# Patient Record
Sex: Male | Born: 2000 | Race: White | Hispanic: No | Marital: Single | Smoking: Former smoker
Health system: Southern US, Community
[De-identification: ages and names within clinical notes are randomized; demographics above are authoritative.]

## PROBLEM LIST (undated history)

## (undated) DIAGNOSIS — K5190 Ulcerative colitis, unspecified, without complications: Secondary | ICD-10-CM

## (undated) DIAGNOSIS — T8859XA Other complications of anesthesia, initial encounter: Secondary | ICD-10-CM

## (undated) DIAGNOSIS — F419 Anxiety disorder, unspecified: Secondary | ICD-10-CM

## (undated) DIAGNOSIS — S299XXA Unspecified injury of thorax, initial encounter: Secondary | ICD-10-CM

## (undated) HISTORY — DX: Ulcerative colitis, unspecified, without complications: K51.90

---
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
## 2020-06-26 ENCOUNTER — Emergency Department (HOSPITAL_COMMUNITY): Payer: Medicaid Other

## 2020-06-26 ENCOUNTER — Encounter (HOSPITAL_COMMUNITY): Payer: Self-pay | Admitting: Emergency Medicine

## 2020-06-26 ENCOUNTER — Other Ambulatory Visit: Payer: Self-pay

## 2020-06-26 DIAGNOSIS — M25572 Pain in left ankle and joints of left foot: Secondary | ICD-10-CM | POA: Diagnosis present

## 2020-06-26 DIAGNOSIS — Z5321 Procedure and treatment not carried out due to patient leaving prior to being seen by health care provider: Secondary | ICD-10-CM | POA: Diagnosis not present
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
## 2020-06-27 ENCOUNTER — Emergency Department (HOSPITAL_COMMUNITY)
Admission: EM | Admit: 2020-06-27 | Discharge: 2020-06-27 | Disposition: A | Discharge status: Home / Self Care | Payer: Medicaid Other | Attending: Emergency Medicine | Admitting: Emergency Medicine

## 2020-11-17 ENCOUNTER — Other Ambulatory Visit: Payer: Self-pay

## 2020-11-17 ENCOUNTER — Encounter (HOSPITAL_COMMUNITY): Payer: Self-pay | Admitting: *Deleted

## 2020-11-17 ENCOUNTER — Emergency Department (HOSPITAL_COMMUNITY)
Admission: EM | Admit: 2020-11-17 | Discharge: 2020-11-17 | Disposition: A | Discharge status: Home / Self Care | Payer: Medicaid Other | Attending: Emergency Medicine | Admitting: Emergency Medicine

## 2020-11-17 DIAGNOSIS — K51819 Other ulcerative colitis with unspecified complications: Secondary | ICD-10-CM

## 2020-11-17 DIAGNOSIS — K51919 Ulcerative colitis, unspecified with unspecified complications: Secondary | ICD-10-CM | POA: Diagnosis not present

## 2020-11-17 DIAGNOSIS — Z7722 Contact with and (suspected) exposure to environmental tobacco smoke (acute) (chronic): Secondary | ICD-10-CM | POA: Diagnosis not present

## 2020-11-17 DIAGNOSIS — R1084 Generalized abdominal pain: Secondary | ICD-10-CM | POA: Diagnosis present

## 2020-11-17 LAB — CBC WITH DIFFERENTIAL/PLATELET
Abs Immature Granulocytes: 0.07 10*3/uL (ref 0.00–0.07)
Basophils Absolute: 0.1 10*3/uL (ref 0.0–0.1)
Basophils Relative: 0 %
Eosinophils Absolute: 1 10*3/uL — ABNORMAL HIGH (ref 0.0–0.5)
Eosinophils Relative: 6 %
HCT: 49.7 % (ref 39.0–52.0)
Hemoglobin: 16.7 g/dL (ref 13.0–17.0)
Immature Granulocytes: 0 %
Lymphocytes Relative: 10 %
Lymphs Abs: 1.8 10*3/uL (ref 0.7–4.0)
MCH: 30.9 pg (ref 26.0–34.0)
MCHC: 33.6 g/dL (ref 30.0–36.0)
MCV: 91.9 fL (ref 80.0–100.0)
Monocytes Absolute: 1 10*3/uL (ref 0.1–1.0)
Monocytes Relative: 5 %
Neutro Abs: 13.9 10*3/uL — ABNORMAL HIGH (ref 1.7–7.7)
Neutrophils Relative %: 79 %
Platelets: 375 10*3/uL (ref 150–400)
RBC: 5.41 MIL/uL (ref 4.22–5.81)
RDW: 13.3 % (ref 11.5–15.5)
WBC: 17.7 10*3/uL — ABNORMAL HIGH (ref 4.0–10.5)
nRBC: 0 % (ref 0.0–0.2)

## 2020-11-17 LAB — BASIC METABOLIC PANEL
Anion gap: 11 (ref 5–15)
BUN: 13 mg/dL (ref 6–20)
CO2: 27 mmol/L (ref 22–32)
Calcium: 9.6 mg/dL (ref 8.9–10.3)
Chloride: 99 mmol/L (ref 98–111)
Creatinine, Ser: 0.87 mg/dL (ref 0.61–1.24)
GFR, Estimated: >60 mL/min (ref >60)
Glucose, Bld: 93 mg/dL (ref 70–99)
Potassium: 3.9 mmol/L (ref 3.5–5.1)
Sodium: 137 mmol/L (ref 135–145)

## 2020-11-17 MED ORDER — PREDNISONE 50 MG PO TABS
60.0000 mg | ORAL_TABLET | Freq: Once | ORAL | Status: AC
  Administered 2020-11-17: 60 mg via ORAL
  Filled 2020-11-17: qty 1

## 2020-11-17 MED ORDER — PREDNISONE 20 MG PO TABS: ORAL_TABLET | ORAL | 0 refills | Status: DC

## 2020-11-17 MED ORDER — ONDANSETRON 4 MG PO TBDP: 4.0000 mg | ORAL_TABLET | Freq: Three times a day (TID) | ORAL | 0 refills | Status: DC | PRN

## 2020-11-17 NOTE — Discharge Instructions (Addendum)
Return if any problems. Schedule to see Gi for evaluation

## 2020-11-17 NOTE — ED Triage Notes (Signed)
Abdominal pain x 2 weeks

## 2020-11-17 NOTE — ED Provider Notes (Signed)
[facility] EMERGENCY DEPARTMENT Provider Note   CSN: [id number] Arrival date & time: 11/17/20  1446     History Chief Complaint  Patient presents with  . Abdominal Pain    Jerry DEFINO is a 20 y.o. male.  The history is provided by the patient. No language interpreter was used.  Abdominal Pain Pain location:  Generalized Pain quality: aching   Pain radiates to:  Does not radiate Pain severity:  Moderate Onset quality:  Gradual Timing:  Constant Progression:  Worsening Chronicity:  New Relieved by:  Nothing Worsened by:  Nothing Ineffective treatments:  None tried Associated symptoms: anorexia and nausea   Risk factors: no recent hospitalization    Pt has a long history of UC followed at [place].  Pt has not been on medication in over a year  Pt reports this feels like his uc flares.  Pt is not currently on any medications.  He has responded well to prednisone in the past.     Past Medical History:  Diagnosis Date  . Anxiety    Is in therapy, not on medication  . Chest wall injury 2010   Hit by baseball, radiography without fracture, discharged home  . Chronic ulcerative colitis [facility])     Patient Active Problem List   Diagnosis Date Noted  . 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 Contact with and suspected exposure to environmental tobacco smoke 11/28/2011    Past Surgical History:  Procedure Laterality Date  . COLONOSCOPY  12/02/2011   Procedure: COLONOSCOPY;  Surgeon: Oletha Blend, MD;  Location: [place];  Service: Gastroenterology;  Laterality: N/A;       Family History  Problem Relation Age of Onset  . Diabetes Other        Maternal family  . Asthma Other        maternal family  . Cancer Other        maternal family  . Heart disease Other        Maternal GGF later in life   . Ulcerative colitis Other        Unrelated step maternal GF   . GI problems Cousin        Mother began asking  paternal family about GI problems and learned of 2 young cousins with "bowel problems".     Social History   Tobacco Use  . Smoking status: Passive Smoke Exposure - Never Smoker  . Smokeless tobacco: Never Used  Vaping Use  . Vaping Use: Every day  Substance Use Topics  . Alcohol use: No  . Drug use: No    Home Medications Prior to Admission medications   Medication Sig Start Date End Date Taking? Authorizing Provider  azaTHIOprine (IMURAN) 50 MG tablet Take 1.5 tablets (75 mg total) by mouth daily. 04/03/14 04/04/15  Oletha Blend, MD  omeprazole (PRILOSEC) 20 MG capsule Take 1 capsule (20 mg total) by mouth daily. 09/12/13 09/12/14  Oletha Blend, MD  predniSONE (DELTASONE) 20 MG tablet Take 1 tablet (20 mg total) by mouth 2 (two) times daily with a meal. 04/03/14 04/04/15  Oletha Blend, MD    Allergies    Penicillins  Review of Systems   Review of Systems  Gastrointestinal: Positive for abdominal pain, anorexia and nausea.  All other systems reviewed and are negative.   Physical Exam Updated Vital Signs BP (!) 126/50   Pulse 89   Temp 98.6 F (  37 C)   Resp 20   Ht 6' 1"  (1.854 m)   SpO2 100%   BMI 18.47 kg/m   Physical Exam Vitals and nursing note reviewed.  Constitutional:      Appearance: He is well-developed.  HENT:     Head: Normocephalic and atraumatic.  Eyes:     Conjunctiva/sclera: Conjunctivae normal.  Cardiovascular:     Rate and Rhythm: Normal rate and regular rhythm.     Heart sounds: No murmur heard.   Pulmonary:     Effort: Pulmonary effort is normal. No respiratory distress.     Breath sounds: Normal breath sounds.  Abdominal:     General: Abdomen is flat. Bowel sounds are normal.     Palpations: Abdomen is soft.     Tenderness: There is no abdominal tenderness.  Musculoskeletal:     Cervical back: Neck supple.  Skin:    General: Skin is warm and dry.  Neurological:     General: No focal deficit present.     Mental Status: He is  alert.  Psychiatric:        Mood and Affect: Mood normal.     ED Results / Procedures / Treatments   Labs (all labs ordered are listed, but only abnormal results are displayed) Labs Reviewed  CBC WITH DIFFERENTIAL/PLATELET - Abnormal; Notable for the following components:      Result Value   WBC 17.7 (*)    Neutro Abs 13.9 (*)    Eosinophils Absolute 1.0 (*)    All other components within normal limits  BASIC METABOLIC PANEL    EKG None  Radiology No results found.  Procedures Procedures   Medications Ordered in ED Medications - No data to display  ED Course  I have reviewed the triage vital signs and the nursing notes.  Pertinent labs & imaging results that were available during my care of the patient were reviewed by me and considered in my medical decision making (see chart for details).    MDM Rules/Calculators/A&P                          MDM:  Pt referred to gi for evaluation.  Rx for zofran and prednsione.  Pt advised to return if any problems.   Final Clinical Impression(s) / ED Diagnoses Final diagnoses:  Other ulcerative colitis with complication [facility])    Rx / DC Orders ED Discharge Orders         Ordered    Ambulatory referral to Gastroenterology        11/17/20 1722    predniSONE (DELTASONE) 20 MG tablet        11/17/20 1727    ondansetron (ZOFRAN ODT) 4 MG disintegrating tablet  Every 8 hours PRN        11/17/20 1733        An After Visit Summary was printed and given to the patient.    [name], Hershal Coria 11/17/20 [place], Julie, MD 11/17/20 Johnnye Lana

## 2020-11-22 ENCOUNTER — Other Ambulatory Visit: Payer: Self-pay

## 2020-11-22 ENCOUNTER — Emergency Department (HOSPITAL_COMMUNITY): Payer: Medicaid Other

## 2020-11-22 ENCOUNTER — Encounter (HOSPITAL_COMMUNITY): Payer: Self-pay | Admitting: Emergency Medicine

## 2020-11-22 ENCOUNTER — Emergency Department (HOSPITAL_COMMUNITY)
Admission: EM | Admit: 2020-11-22 | Discharge: 2020-11-22 | Disposition: A | Discharge status: Home / Self Care | Payer: Medicaid Other | Attending: Emergency Medicine | Admitting: Emergency Medicine

## 2020-11-22 DIAGNOSIS — Z7722 Contact with and (suspected) exposure to environmental tobacco smoke (acute) (chronic): Secondary | ICD-10-CM | POA: Insufficient documentation

## 2020-11-22 DIAGNOSIS — R079 Chest pain, unspecified: Secondary | ICD-10-CM | POA: Diagnosis not present

## 2020-11-22 LAB — BASIC METABOLIC PANEL
Anion gap: 11 (ref 5–15)
BUN: 11 mg/dL (ref 6–20)
CO2: 27 mmol/L (ref 22–32)
Calcium: 9.3 mg/dL (ref 8.9–10.3)
Chloride: 101 mmol/L (ref 98–111)
Creatinine, Ser: 0.85 mg/dL (ref 0.61–1.24)
GFR, Estimated: >60 mL/min (ref >60)
Glucose, Bld: 124 mg/dL — ABNORMAL HIGH (ref 70–99)
Potassium: 3.7 mmol/L (ref 3.5–5.1)
Sodium: 139 mmol/L (ref 135–145)

## 2020-11-22 LAB — CBC WITH DIFFERENTIAL/PLATELET
Abs Immature Granulocytes: 0.06 10*3/uL (ref 0.00–0.07)
Basophils Absolute: 0.1 10*3/uL (ref 0.0–0.1)
Basophils Relative: 1 %
Eosinophils Absolute: 0.4 10*3/uL (ref 0.0–0.5)
Eosinophils Relative: 3 %
HCT: 40.8 % (ref 39.0–52.0)
Hemoglobin: 13.8 g/dL (ref 13.0–17.0)
Immature Granulocytes: 0 %
Lymphocytes Relative: 30 %
Lymphs Abs: 4.4 10*3/uL — ABNORMAL HIGH (ref 0.7–4.0)
MCH: 30.8 pg (ref 26.0–34.0)
MCHC: 33.8 g/dL (ref 30.0–36.0)
MCV: 91.1 fL (ref 80.0–100.0)
Monocytes Absolute: 1.1 10*3/uL — ABNORMAL HIGH (ref 0.1–1.0)
Monocytes Relative: 8 %
Neutro Abs: 8.5 10*3/uL — ABNORMAL HIGH (ref 1.7–7.7)
Neutrophils Relative %: 58 %
Platelets: 428 10*3/uL — ABNORMAL HIGH (ref 150–400)
RBC: 4.48 MIL/uL (ref 4.22–5.81)
RDW: 13.3 % (ref 11.5–15.5)
WBC: 14.6 10*3/uL — ABNORMAL HIGH (ref 4.0–10.5)
nRBC: 0 % (ref 0.0–0.2)

## 2020-11-22 LAB — TROPONIN I (HIGH SENSITIVITY)
Troponin I (High Sensitivity): 3 ng/L (ref <18)
Troponin I (High Sensitivity): 2 ng/L (ref <18)

## 2020-11-22 NOTE — ED Notes (Signed)
Pt back from x-ray.

## 2020-11-22 NOTE — ED Triage Notes (Signed)
Pt c/o sharp chest pains starting this morning. Pt states its in the left chest and does not radiate.   Pt had a pain during triage in the central chest during triage and says it feels like heart burn.

## 2020-11-22 NOTE — Discharge Instructions (Signed)
Follow up with primary care provider

## 2020-11-22 NOTE — ED Provider Notes (Signed)
[facility] EMERGENCY DEPARTMENT Provider Note   CSN: [id number] Arrival date & time: 11/22/20  1334     History Chief Complaint  Patient presents with  . Chest Pain    Jerry Cordova is a 20 y.o. male with past medical history significant for ulcerative colitis, anxiety who presents for evaluation of chest pain.  Has intermittent left-sided chest pain.  Described as sharp and stabbing.  Will get a few stabbing feelings and resolve.  He has had this previously. Pain this time over the last 2 weeks. Was seen here for abd pain earlier this week. Dx with likely colitis flare. Given steroids. States he did not mention his CP to the previous provider. States he had this as a child, with cardiology however did not return.  He is unsure if anything was found.  Pain does not radiate into his back, left arm or jaw.  Nonexertional nonpleuritic in nature.  Patient states happens "occasionally."  No recent traumatic injuries to chest.  No cough, shortness of breath or hemoptysis.  No prior history of PE, DVT, unilateral leg pain or edema, redness or warmth.  He has no chest pain with exertion, no syncope, no family history of sudden cardiac death.  Patient states he gets pain for approximately 5 minutes, resolves.  No current pain.  Denies additional aggrivating or alleviating factors.  Has not taken anything for symptoms.  History obtained from patient and past medical records.  No interpreter used.  HPI     Past Medical History:  Diagnosis Date  . Anxiety    Is in therapy, not on medication  . Chest wall injury 2010   Hit by baseball, radiography without fracture, discharged home  . Chronic ulcerative colitis [facility])     Patient Active Problem List   Diagnosis Date Noted  . Universal ulcerative colitis ([place]) 12/05/2011  . 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 Contact with and suspected exposure to environmental tobacco smoke 11/28/2011    Past Surgical History:  Procedure Laterality  Date  . COLONOSCOPY  12/02/2011   Procedure: COLONOSCOPY;  Surgeon: Oletha Blend, MD;  Location: [place];  Service: Gastroenterology;  Laterality: N/A;       Family History  Problem Relation Age of Onset  . Diabetes Other        Maternal family  . Asthma Other        maternal family  . Cancer Other        maternal family  . Heart disease Other        Maternal GGF later in life   . Ulcerative colitis Other        Unrelated step maternal GF   . GI problems Cousin        Mother began asking paternal family about GI problems and learned of 2 young cousins with "bowel problems".     Social History   Tobacco Use  . Smoking status: Passive Smoke Exposure - Never Smoker  . Smokeless tobacco: Never Used  Vaping Use  . Vaping Use: Every day  Substance Use Topics  . Alcohol use: No  . Drug use: No    Home Medications Prior to Admission medications   Medication Sig Start Date End Date Taking? Authorizing Provider  ondansetron (ZOFRAN ODT) 4 MG disintegrating tablet Take 1 tablet (4 mg total) by mouth every 8 (eight) hours as needed for nausea or vomiting. 11/17/20  Yes Caryl Ada K, PA-C  predniSONE (DELTASONE) 20 MG tablet One  tablet twice a day for 5 days then one a day for 5 days 11/17/20  Yes [name], PA-C  sertraline (ZOLOFT) 25 MG tablet Take 25 mg by mouth daily. 11/20/20  Yes [provider]  azaTHIOprine (IMURAN) 50 MG tablet Take 1.5 tablets (75 mg total) by mouth daily. 04/03/14 04/04/15  Oletha Blend, MD  omeprazole (PRILOSEC) 20 MG capsule Take 1 capsule (20 mg total) by mouth daily. 09/12/13 09/12/14  Oletha Blend, MD    Allergies    Penicillins  Review of Systems   Review of Systems  Constitutional: Negative.   HENT: Negative.   Respiratory: Negative.   Cardiovascular: Positive for chest pain. Negative for palpitations and leg swelling.  Gastrointestinal: Negative.   Genitourinary: Negative.   Musculoskeletal: Negative.   Skin: Negative.    Neurological: Negative.   All other systems reviewed and are negative.   Physical Exam Updated Vital Signs BP (!) 130/59   Pulse (!) 59   Temp 98 F (36.7 C)   Resp 18   Ht 6' 1"  (1.854 m)   Wt 63.5 kg   SpO2 98%   BMI 18.47 kg/m   Physical Exam Vitals and nursing note reviewed.  Constitutional:      General: He is not in acute distress.    Appearance: He is well-developed. He is not ill-appearing, toxic-appearing or diaphoretic.  HENT:     Head: Normocephalic and atraumatic.  Eyes:     Pupils: Pupils are equal, round, and reactive to light.  Cardiovascular:     Rate and Rhythm: Normal rate and regular rhythm.     Pulses:          Radial pulses are 2+ on the right side and 2+ on the left side.     Heart sounds: Normal heart sounds.  Pulmonary:     Effort: Pulmonary effort is normal. No respiratory distress.     Breath sounds: Normal breath sounds.     Comments: Clear to auscultation bilaterally.  Speaks in full sentences without difficulty. Chest:     Comments: Equal rise and fall to chest wall.  No tenderness, crepitus or step-off Abdominal:     General: Bowel sounds are normal. There is no distension.     Palpations: Abdomen is soft.     Tenderness: There is no abdominal tenderness. There is no guarding or rebound.     Comments: Soft, nontender  Musculoskeletal:        General: Normal range of motion.     Cervical back: Normal range of motion and neck supple.     Right lower leg: No tenderness. No edema.     Left lower leg: No tenderness. No edema.     Comments: No bony tenderness.  Moves all 4 extremities at difficulty.  Compartment soft.  Skin:    General: Skin is warm and dry.     Capillary Refill: Capillary refill takes less than 2 seconds.     Comments: No rashes or lesions  Neurological:     General: No focal deficit present.     Mental Status: He is alert.     Comments: Moves all 4 extremities at difficulty.  Ambulatory    ED Results / Procedures /  Treatments   Labs (all labs ordered are listed, but only abnormal results are displayed) Labs Reviewed  CBC WITH DIFFERENTIAL/PLATELET - Abnormal; Notable for the following components:      Result Value   WBC 14.6 (*)  Platelets 428 (*)    Neutro Abs 8.5 (*)    Lymphs Abs 4.4 (*)    Monocytes Absolute 1.1 (*)    All other components within normal limits  BASIC METABOLIC PANEL - Abnormal; Notable for the following components:   Glucose, Bld 124 (*)    All other components within normal limits  TROPONIN I (HIGH SENSITIVITY)  TROPONIN I (HIGH SENSITIVITY)    EKG EKG Interpretation  Date/Time:  Saturday November 22 2020 13:41:54 EDT Ventricular Rate:  85 PR Interval:  143 QRS Duration: 98 QT Interval:  352 QTC Calculation: 419 R Axis:   76 Text Interpretation: Sinus rhythm Nonspecific T abnormalities, lateral leads No STEMI Confirmed by Octaviano Glow [phone]) on 11/22/2020 4:31:47 PM   Radiology DG Chest 2 View  Result Date: 11/22/2020 CLINICAL DATA:  Chest pain EXAM: CHEST - 2 VIEW COMPARISON:  Jan 06, 2009 FINDINGS: The lungs are clear. Heart size and pulmonary vascularity are normal. No adenopathy. No pneumothorax. There is slight lower thoracic levoscoliosis. IMPRESSION: Lungs clear.  Cardiac silhouette normal. Electronically Signed   By: Lowella Grip III M.D.   On: 11/22/2020 15:04    Procedures Procedures   Medications Ordered in ED Medications - No data to display  ED Course  I have reviewed the triage vital signs and the nursing notes.  Pertinent labs & imaging results that were available during my care of the patient were reviewed by me and considered in my medical decision making (see chart for details).  20 year old here for evaluation of intermittent left-sided chest pain over the last 2 weeks.  Has history of similar.  Is nonexertional nonpleuritic in nature.  No clinical evidence of DVT on exam.  He currently has no pain.  No family history of sudden  cardiac death.  He is PERC negative, Wells criteria low risk.  No cough, shortness of breath.  No neck pain, back pain, jaw pain.  We will plan on labs, imaging and reassess.  Labs and imaging personally reviewed and interpreted:  Leukocytosis at 14.6, similar to prior, on steroids Metabolic panel without electrolyte, renal or normality Delta troponin negative EKG without ischemia, no WPW, prolonged QT, Brugada,  DG chest without cardiomegaly, pleural edema, pneumothorax  Patient reassessed.  Continues to be chest pain-free.  Work-up reassuring.  He has had this pain previously and was seen by cardiology.  We will have him follow-up with them.  Patient is to be discharged with recommendation to follow up with PCP in regards to today's hospital visit. Chest pain is not likely of cardiac or pulmonary etiology d/t presentation, PERC negative, VSS, no tracheal deviation, no JVD or new murmur, RRR, breath sounds equal bilaterally, EKG without acute abnormalities, negative troponin, and negative CXR.  Low suspicion for acute ACS, PE, dissection, HCM, CHF, Pneumothorax, infectious process.  The patient has been appropriately medically screened and/or stabilized in the ED. I have low suspicion for any other emergent medical condition which would require further screening, evaluation or treatment in the ED or require inpatient management.  Patient is hemodynamically stable and in no acute distress.  Patient able to ambulate in department prior to ED.  Evaluation does not show acute pathology that would require ongoing or additional emergent interventions while in the emergency department or further inpatient treatment.  I have discussed the diagnosis with the patient and answered all questions.  Pain is been managed while in the emergency department and patient has no further complaints prior to discharge.  Patient  is comfortable with plan discussed in room and is stable for discharge at this time.  I have  discussed strict return precautions for returning to the emergency department.  Patient was encouraged to follow-up with PCP/specialist refer to at discharge.    MDM Rules/Calculators/A&P                           Final Clinical Impression(s) / ED Diagnoses Final diagnoses:  Chest pain, unspecified type    Rx / DC Orders ED Discharge Orders    None       Ethon Wymer A, PA-C 11/22/20 Daron Offer, MD 11/23/20 [phone]

## 2020-11-30 ENCOUNTER — Other Ambulatory Visit: Payer: Self-pay

## 2020-11-30 ENCOUNTER — Emergency Department (HOSPITAL_COMMUNITY): Payer: Medicaid Other

## 2020-11-30 ENCOUNTER — Encounter (HOSPITAL_COMMUNITY): Payer: Self-pay | Admitting: *Deleted

## 2020-11-30 ENCOUNTER — Emergency Department (HOSPITAL_COMMUNITY)
Admission: EM | Admit: 2020-11-30 | Discharge: 2020-11-30 | Disposition: A | Discharge status: Home / Self Care | Payer: Medicaid Other | Attending: Emergency Medicine | Admitting: Emergency Medicine

## 2020-11-30 DIAGNOSIS — Z20822 Contact with and (suspected) exposure to covid-19: Secondary | ICD-10-CM | POA: Insufficient documentation

## 2020-11-30 DIAGNOSIS — R0602 Shortness of breath: Secondary | ICD-10-CM | POA: Diagnosis not present

## 2020-11-30 DIAGNOSIS — Z7722 Contact with and (suspected) exposure to environmental tobacco smoke (acute) (chronic): Secondary | ICD-10-CM | POA: Diagnosis not present

## 2020-11-30 DIAGNOSIS — R0789 Other chest pain: Secondary | ICD-10-CM | POA: Insufficient documentation

## 2020-11-30 DIAGNOSIS — R079 Chest pain, unspecified: Secondary | ICD-10-CM | POA: Diagnosis present

## 2020-11-30 LAB — COMPREHENSIVE METABOLIC PANEL
ALT: 23 U/L (ref 0–44)
AST: 21 U/L (ref 15–41)
Albumin: 4.2 g/dL (ref 3.5–5.0)
Alkaline Phosphatase: 85 U/L (ref 38–126)
Anion gap: 9 (ref 5–15)
BUN: 10 mg/dL (ref 6–20)
CO2: 26 mmol/L (ref 22–32)
Calcium: 9.4 mg/dL (ref 8.9–10.3)
Chloride: 100 mmol/L (ref 98–111)
Creatinine, Ser: 0.84 mg/dL (ref 0.61–1.24)
GFR, Estimated: >60 mL/min (ref >60)
Glucose, Bld: 106 mg/dL — ABNORMAL HIGH (ref 70–99)
Potassium: 4.4 mmol/L (ref 3.5–5.1)
Sodium: 135 mmol/L (ref 135–145)
Total Bilirubin: 0.5 mg/dL (ref 0.3–1.2)
Total Protein: 8.7 g/dL — ABNORMAL HIGH (ref 6.5–8.1)

## 2020-11-30 LAB — RESP PANEL BY RT-PCR (FLU A&B, COVID) ARPGX2
Influenza A by PCR: NEGATIVE
Influenza B by PCR: NEGATIVE
SARS Coronavirus 2 by RT PCR: NEGATIVE

## 2020-11-30 LAB — D-DIMER, QUANTITATIVE: D-Dimer, Quant: 1.18 ug/mL-FEU — ABNORMAL HIGH (ref 0.00–0.50)

## 2020-11-30 LAB — TROPONIN I (HIGH SENSITIVITY)
Troponin I (High Sensitivity): 2 ng/L (ref <18)
Troponin I (High Sensitivity): 3 ng/L (ref <18)

## 2020-11-30 LAB — CBC
HCT: 48.8 % (ref 39.0–52.0)
Hemoglobin: 16.1 g/dL (ref 13.0–17.0)
MCH: 30.6 pg (ref 26.0–34.0)
MCHC: 33 g/dL (ref 30.0–36.0)
MCV: 92.6 fL (ref 80.0–100.0)
Platelets: 514 10*3/uL — ABNORMAL HIGH (ref 150–400)
RBC: 5.27 MIL/uL (ref 4.22–5.81)
RDW: 14.1 % (ref 11.5–15.5)
WBC: 21 10*3/uL — ABNORMAL HIGH (ref 4.0–10.5)
nRBC: 0 % (ref 0.0–0.2)

## 2020-11-30 MED ORDER — HYDROCODONE-ACETAMINOPHEN 5-325 MG PO TABS
1.0000 | ORAL_TABLET | Freq: Once | ORAL | Status: AC
  Administered 2020-11-30: 1 via ORAL
  Filled 2020-11-30: qty 1

## 2020-11-30 MED ORDER — ALBUTEROL SULFATE HFA 108 (90 BASE) MCG/ACT IN AERS: 1.0000 | INHALATION_SPRAY | Freq: Four times a day (QID) | RESPIRATORY_TRACT | 0 refills | Status: DC | PRN

## 2020-11-30 MED ORDER — IOHEXOL 350 MG/ML SOLN
75.0000 mL | Freq: Once | INTRAVENOUS | Status: AC | PRN
  Administered 2020-11-30: 75 mL via INTRAVENOUS

## 2020-11-30 NOTE — ED Notes (Signed)
Patient transported to CT 

## 2020-11-30 NOTE — ED Notes (Signed)
Pt ambulated to restroom, states he had chest pain with ambulation, pt also states he feels SOB while ambulating

## 2020-11-30 NOTE — ED Notes (Signed)
Ambulated pt with pulse ox, O2 dropped to 88%, went back up to 97% at rest. Pt stated he felt a little SOB and dizzy, denied chest pain. PA made aware.

## 2020-11-30 NOTE — Discharge Instructions (Addendum)
Your work-up today was overall reassuring.  We have provided an albuterol inhaler for your shortness of breath, you may use this as needed.  Continue to take Tylenol or ibuprofen for pain.  You were tested for COVID-19 today, you may follow-up on these results via [place].  There are instructions on your discharge paperwork on how to download this.  If this is positive, please quarantine for an additional 7 to 10 days.  I have provided cardiology follow-up as well.  Make sure to follow-up with your primary care doctor.  Return to the ER for any new or worsening symptoms.

## 2020-11-30 NOTE — ED Provider Notes (Signed)
[facility] EMERGENCY DEPARTMENT Provider Note   CSN: [id number] Arrival date & time: 11/30/20  1823     History Chief Complaint  Patient presents with  . Chest Pain    DESSIE DELCARLO is a 20 y.o. male.  HPI 20 year old male with a history of anxiety, chest wall injury, chronic ulcerative colitis, recent a steroid pack today presents to the ER with complaints of chest pain.  He was seen here on 4/9 with same complaint.  Delta troponin, overall reassuring work-up.  He states that the pain has continued, worse when he walks.  Feels central, sometimes is sharp and stabbing.  He has been taking Tylenol and some ibuprofen for pain with little relief.  He states he followed up with his PCP and was told that he has costochondritis.  His symptoms have continued however.  He also has some shortness of breath with ambulation, though he states that this is not uncommon for him.  He denies any leg swelling.  No prior history of PE.  No fevers, chills, cough.    Past Medical History:  Diagnosis Date  . Anxiety    Is in therapy, not on medication  . Chest wall injury 2010   Hit by baseball, radiography without fracture, discharged home  . Chronic ulcerative colitis [facility])     Patient Active Problem List   Diagnosis Date Noted  . 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 Contact with and suspected exposure to environmental tobacco smoke 11/28/2011    Past Surgical History:  Procedure Laterality Date  . COLONOSCOPY  12/02/2011   Procedure: COLONOSCOPY;  Surgeon: Oletha Blend, MD;  Location: [place];  Service: Gastroenterology;  Laterality: N/A;       Family History  Problem Relation Age of Onset  . Diabetes Other        Maternal family  . Asthma Other        maternal family  . Cancer Other        maternal family  . Heart disease Other        Maternal GGF later in life   . Ulcerative colitis Other        Unrelated step maternal  GF   . GI problems Cousin        Mother began asking paternal family about GI problems and learned of 2 young cousins with "bowel problems".     Social History   Tobacco Use  . Smoking status: Passive Smoke Exposure - Never Smoker  . Smokeless tobacco: Never Used  Vaping Use  . Vaping Use: Every day  Substance Use Topics  . Alcohol use: No  . Drug use: No    Home Medications Prior to Admission medications   Medication Sig Start Date End Date Taking? Authorizing Provider  acetaminophen (TYLENOL) 325 MG tablet Take 650 mg by mouth every 6 (six) hours as needed.   Yes [provider]  albuterol (VENTOLIN HFA) 108 (90 Base) MCG/ACT inhaler Inhale 1-2 puffs into the lungs every 6 (six) hours as needed for wheezing or shortness of breath. 11/30/20  Yes Garald Balding, PA-C  ibuprofen (ADVIL) 200 MG tablet Take 400 mg by mouth every 6 (six) hours as needed.   Yes [provider]  predniSONE (DELTASONE) 20 MG tablet One tablet twice a day for 5 days then one a day for 5 days 11/17/20  Yes Threasa Alpha, Hollace Kinnier, PA-C  azaTHIOprine (IMURAN) 50 MG tablet Take  1.5 tablets (75 mg total) by mouth daily. Patient not taking: Reported on 11/30/2020 04/03/14 04/04/15  Oletha Blend, MD  NATROBA 0.9 % SUSP Apply topically. Patient not taking: Reported on 11/30/2020 11/28/20   [provider]  omeprazole (PRILOSEC) 20 MG capsule Take 1 capsule (20 mg total) by mouth daily. Patient not taking: Reported on 11/30/2020 09/12/13 09/12/14  Oletha Blend, MD  ondansetron (ZOFRAN ODT) 4 MG disintegrating tablet Take 1 tablet (4 mg total) by mouth every 8 (eight) hours as needed for nausea or vomiting. Patient not taking: Reported on 11/30/2020 11/17/20   [name], PA-C  sertraline (ZOLOFT) 25 MG tablet Take 25 mg by mouth daily. Patient not taking: Reported on 11/30/2020 11/20/20   [provider]    Allergies    Penicillins  Review of Systems   Review of Systems   Constitutional: Negative for chills and fever.  HENT: Negative for ear pain and sore throat.   Eyes: Negative for pain and visual disturbance.  Respiratory: Positive for shortness of breath. Negative for cough.   Cardiovascular: Positive for chest pain. Negative for palpitations.  Gastrointestinal: Negative for abdominal pain and vomiting.  Genitourinary: Negative for dysuria and hematuria.  Musculoskeletal: Negative for arthralgias and back pain.  Skin: Negative for color change and rash.  Neurological: Negative for seizures and syncope.  All other systems reviewed and are negative.   Physical Exam Updated Vital Signs BP 129/72   Pulse (!) 59   Temp 98.7 F (37.1 C) (Oral)   Resp 17   SpO2 96%   Physical Exam Vitals and nursing note reviewed.  Constitutional:      General: He is not in acute distress.    Appearance: He is well-developed. He is not ill-appearing, toxic-appearing or diaphoretic.  HENT:     Head: Normocephalic and atraumatic.  Eyes:     Conjunctiva/sclera: Conjunctivae normal.  Cardiovascular:     Rate and Rhythm: Normal rate and regular rhythm.     Heart sounds: No murmur heard.   Pulmonary:     Effort: Pulmonary effort is normal. No respiratory distress.     Breath sounds: Normal breath sounds.  Chest:       Comments: Reproducible chest wall tenderness to the sternum. Abdominal:     Palpations: Abdomen is soft.     Tenderness: There is no abdominal tenderness.  Musculoskeletal:        General: Normal range of motion.     Cervical back: Neck supple.     Right lower leg: No edema.     Left lower leg: No edema.  Skin:    General: Skin is warm and dry.     Capillary Refill: Capillary refill takes less than 2 seconds.  Neurological:     General: No focal deficit present.     Mental Status: He is alert and oriented to person, place, and time.     Sensory: Sensory deficit present.     Motor: No weakness.     ED Results / Procedures /  Treatments   Labs (all labs ordered are listed, but only abnormal results are displayed) Labs Reviewed  CBC - Abnormal; Notable for the following components:      Result Value   WBC 21.0 (*)    Platelets 514 (*)    All other components within normal limits  COMPREHENSIVE METABOLIC PANEL - Abnormal; Notable for the following components:   Glucose, Bld 106 (*)    Total Protein 8.7 (*)  All other components within normal limits  D-DIMER, QUANTITATIVE - Abnormal; Notable for the following components:   D-Dimer, Quant 1.18 (*)    All other components within normal limits  RESP PANEL BY RT-PCR (FLU A&B, COVID) ARPGX2  TROPONIN I (HIGH SENSITIVITY)  TROPONIN I (HIGH SENSITIVITY)    EKG EKG Interpretation  Date/Time:  Sunday November 30 2020 19:04:38 EDT Ventricular Rate:  67 PR Interval:  143 QRS Duration: 85 QT Interval:  372 QTC Calculation: 393 R Axis:   75 Text Interpretation: Sinus rhythm ST elev, probable normal early repol pattern Confirmed by Zammit, Joseph ([id number]) on 11/30/2020 7:14:02 PM   Radiology CT Angio Chest PE W and/or Wo Contrast  Result Date: 11/30/2020 CLINICAL DATA:  Left-sided chest pain EXAM: CT ANGIOGRAPHY CHEST WITH CONTRAST TECHNIQUE: Multidetector CT imaging of the chest was performed using the standard protocol during bolus administration of intravenous contrast. Multiplanar CT image reconstructions and MIPs were obtained to evaluate the vascular anatomy. CONTRAST:  75mL OMNIPAQUE IOHEXOL 350 MG/ML SOLN COMPARISON:  Chest x-ray from earlier in the same day. FINDINGS: Cardiovascular: Thoracic aorta and its branches are within normal limits. No cardiac enlargement is seen. No coronary calcifications are noted. Pulmonary artery is well visualized. Normal branching pattern is noted. No filling defects to suggest pulmonary embolism are identified. Mediastinum/Nodes: Thoracic inlet is within normal limits. No sizable hilar or mediastinal adenopathy is noted. The  esophagus is within normal limits. Lungs/Pleura: The lungs are well aerated bilaterally. No focal infiltrate or sizable effusion is seen. Upper Abdomen: Visualized upper abdomen shows no acute abnormality. Musculoskeletal: No acute bony abnormality is noted. Review of the MIP images confirms the above findings. IMPRESSION: No acute abnormality noted.  No pulmonary emboli are seen. Electronically Signed   By: Mark  Lukens M.D.   On: 11/30/2020 20:45   DG Chest Portable 1 View  Result Date: 11/30/2020 CLINICAL DATA:  Pt with continued left sided chest pain and SOB x a few weeks. Pt denies a cough. No previous surgery to chest. No pending Covid test. EXAM: PORTABLE CHEST 1 VIEW COMPARISON:  Chest radiograph 11/22/2020 FINDINGS: The cardiomediastinal contours are within normal limits. The lungs are clear. No pneumothorax or pleural effusion. No acute finding in the visualized skeleton. IMPRESSION: No acute cardiopulmonary process. Electronically Signed   By: Nancy  Ballantyne M.D.   On: 11/30/2020 19:30    Procedures Procedures   Medications Ordered in ED Medications  iohexol (OMNIPAQUE) 350 MG/ML injection 75 mL (75 mLs Intravenous Contrast Given 11/30/20 2028)  HYDROcodone-acetaminophen (NORCO/VICODIN) 5-325 MG per tablet 1 tablet (1 tablet Oral Given 11/30/20 2303)    ED Course  I have reviewed the triage vital signs and the nursing notes.  Pertinent labs & imaging results that were available during my care of the patient were reviewed by me and considered in my medical decision making (see chart for details).    MDM Rules/Calculators/A&P                          19  year old male presents with chest pain.  On arrival, vitals are overall reassuring, afebrile, not tachycardic, tachypneic or hypoxic.  Lung sounds are clear, no wheezes.  He does have reproducible chest wall tenderness on exam.  I did explain to the patient that he had a pretty reassuring work-up on the 9th, however did think it was  reasonable to repeat a chest x-ray and an EKG.  His chest x-ray was normal, EKG did  show some slight changes with questionable ST elevations in all leads.  He does have a leukocytosis of 21, however has been taking a steroid pack, finishing his last dose today.  His CMP is largely unremarkable.  D-dimer was elevated at 1.18.  Repeat troponin is negative.  Patient was ambulated, did desaturate to 88%, with some mildly shortness of breath, however quickly recovered to 97%. Discussed this with Dr. Roderic Palau who feels given reassuring workup this does not warrant admission.   He is not requiring any supplemental oxygen.  Again no evidence of PE on scan.  Will swab for Covid, however this has been ongoing for quite some time and my suspicion for COVID-19 is low at this time.  Patient given an albuterol inhaler to use, encouraged to continue to take Tylenol and ibuprofen.  He was given a dose of Norco here.  Stressed follow-up with PCP, will refer to cardiology as well.  We discussed return precautions.  He voiced understanding and is agreeable.  Stable for discharge.  Case discussed with Dr. Roderic Palau who is agreeable to the above plan and disposition Final Clinical Impression(s) / ED Diagnoses Final diagnoses:  Chest pain, unspecified type    Rx / DC Orders ED Discharge Orders         Ordered    albuterol (VENTOLIN HFA) 108 (90 Base) MCG/ACT inhaler  Every 6 hours PRN        11/30/20 2259           Garald Balding, PA-C 11/30/20 2318    Milton Ferguson, MD 12/02/20 [phone]

## 2020-11-30 NOTE — ED Triage Notes (Signed)
Pt with continued left sided CP.  Last seen on 11/22/20 for same.  + sob

## 2020-12-20 ENCOUNTER — Encounter: Payer: Self-pay | Admitting: Gastroenterology

## 2020-12-20 NOTE — H&P (View-Only) (Signed)
 Referring Provider: Sofia, Leslie K, PA-C ([place] [facility]) Primary Care Physician:  [facility] Primary Gastroenterologist:  Dr. Carver  Chief Complaint  Patient presents with  . Ulcerative Colitis    Had UC for 9 years. Some flares recently. Rectal bleeding    HPI:   Jerry Cordova is a 20 y.o. male presenting today at the request of Sofia, Leslie K, PA-C ( ED) for ulcerative colitis.   Patient has history of ulcerative pancolitis diagnosed in 2013. Previously followed by pediatric GI at [facility].  He was on Remicade at one point, but he developed antibodies and became symptomatic so he was transitioned to Humira with methotrexate.  Notably, he also had a urticarial rashes with Remicade infusions and this was treated by antihistamines.  It appears patient was last seen by [facility] in May 2020 and was on Humira 40 mg every other week with methotrexate 15mg weekly and folate.  Symptoms were doing well at that time and he was advised to continue his current medication regimen.  Patient was seen in the emergency room 11/17/2020 for abdominal pain.  Reported he has not been on any medications for UC in over a year, but felt the symptoms were similar to flares.  CBC remarkable for WBC 17.7.  BMP within normal limits.  He was prescribed Zofran and prednisone 20 mg twice daily x5 days then 1 daily for 5 days.    Today:  Has not been on any medications for ulcerative colitis in over a year.  States he felt very well after stopping his medications until about 1-2 months ago when he began to develop lower abdominal pain and diarrhea with rectal bleeding.  Somewhat similar to prior UC flares.  Pain can radiate to his upper abdomen which is different than prior UC flares.  Pain comes and goes and can be triggered by food.  Can get up to 8/10 in severity.  Does not take anything for abdominal pain.  Reports 1 on prednisone, this did not help his abdominal pain, did not change his  diarrhea.  Currently with 5-10+ BMs daily.  No rectal bleeding in the last 2 days.  No melena. Denies recent antibiotics, hospitalizations, sick contacts, fever, chills, cold or flulike symptoms.  Admits to weight loss.  Usually weighs around 145 pounds.  Occasional lightheadedness with position changes.  Denies nausea, vomiting.  Rare reflux symptoms.  No dysphagia.  No NSAIDs.  Colonoscopy and EGD in 2019: Colonoscopy impression: Moderately active ulcerative colitis; pancolitis with some patulous in the rectum, biopsied.  The examined portion of the ileum was normal s/p biopsy.  TI biopsy was benign, cecal biopsy with quiescent chronic colitis, no active colitis, ascending colon biopsy benign, transverse colon biopsy benign, mild chronic active colitis in the descending and sigmoid colon.  Rectum with mild active colitis, no chronic colitis.   EGD: Normal exam.  Duodenal biopsies with no inflammation or villous blunting.  Gastric biopsies with mild patchy chronic gastritis.  H. pylori negative.  Esophageal biopsies benign.  Past Medical History:  Diagnosis Date  . Anxiety    Is in therapy, not on medication  . Chest wall injury 2010   Hit by baseball, radiography without fracture, discharged home  . Chronic ulcerative colitis (HCC)     Past Surgical History:  Procedure Laterality Date  . COLONOSCOPY  12/02/2011    Surgeon: Joseph H Clark, MD; entire colonic mucosa with marked friability, granularity, edema, and erythema with no   skip lesions or rectal sparing.  Unable to negotiate through the ascending colon to directly visualize the cecum.  Multiple biopsies were taken and all revealed chronic active colitis consistent with IBD.  . COLONOSCOPY  2019   [facility]- Moderately active UC, pancolitis with some patulous in the rectum.  Pathology: TI biopsy benign, cecal biopsy with quiescent chronic colitis, no active colitis, ascending and transverse colon biopsies benign, mild chronic active colitis in  the descending and sigmoid colon, mild active colitis in the rectum.  . ESOPHAGOGASTRODUODENOSCOPY  2019   [facility]; Normal exam.  Duodenal biopsies with no inflammation or villous blunting.  Gastric biopsies with mild patchy chronic gastritis.  H. pylori negative.  Esophageal biopsies benign.    Current Outpatient Medications  Medication Sig Dispense Refill  . acetaminophen (TYLENOL) 325 MG tablet Take 650 mg by mouth every 6 (six) hours as needed.    . albuterol (VENTOLIN HFA) 108 (90 Base) MCG/ACT inhaler Inhale 1-2 puffs into the lungs every 6 (six) hours as needed for wheezing or shortness of breath. 1 each 0   No current facility-administered medications for this visit.    Allergies as of 12/22/2020 - Review Complete 12/22/2020  Allergen Reaction Noted  . Penicillins Hives and Rash 11/28/2011  . Remicade [infliximab] Hives 12/22/2020    Family History  Problem Relation Age of Onset  . Diabetes Other        Maternal family  . Asthma Other        maternal family  . Cancer Other        maternal family  . Heart disease Other        Maternal GGF later in life   . Ulcerative colitis Other        Unrelated step maternal GF   . GI problems Cousin        Mother began asking paternal family about GI problems and learned of 2 young cousins with "bowel problems".   . Colon cancer Neg Hx     Social History   Socioeconomic History  . Marital status: Single    Spouse name: Not on file  . Number of children: Not on file  . Years of education: Not on file  . Highest education level: Not on file  Occupational History  . Not on file  Tobacco Use  . Smoking status: Passive Smoke Exposure - Never Smoker  . Smokeless tobacco: Current User    Types: Chew  Vaping Use  . Vaping Use: Every day  Substance and Sexual Activity  . Alcohol use: No  . Drug use: No  . Sexual activity: Never  Other Topics Concern  . Not on file  Social History Narrative  . Not on file   Social  Determinants of Health   Financial Resource Strain: Not on file  Food Insecurity: Not on file  Transportation Needs: Not on file  Physical Activity: Not on file  Stress: Not on file  Social Connections: Not on file  Intimate Partner Violence: Not on file    Review of Systems: Gen: See HPI  CV: Denies chest pain or palpitations. Resp: Denies shortness of breath or cough. GI: See HPI. GU : Denies urinary burning, urinary frequency, urinary hesitancy MS: Denies joint pain Derm: Denies rash Psych: History of anxiety. Heme: See HPI  Physical Exam: BP 126/79   Pulse 76   Temp (!) 96.9 F (36.1 C) (Temporal)   Ht 6' 1" (1.854 m)   Wt 138 lb 12.8 oz (  63 kg)   BMI 18.31 kg/m  General:   Alert and oriented. Pleasant and cooperative. Well-developed. Thin.  Head:  Normocephalic and atraumatic. Eyes:  Without icterus, sclera clear and conjunctiva pink.  Ears:  Normal auditory acuity. Lungs:  Clear to auscultation bilaterally. No wheezes, rales, or rhonchi. No distress.  Heart:  S1, S2 present without murmurs appreciated.  Abdomen:  +BS, soft, and non-distended. Minimal TTP across the lower abdomen. No HSM noted. No guarding or rebound. No masses appreciated.  Rectal:  Deferred  Msk:  Symmetrical without gross deformities. Normal posture. Extremities:  Without edema. Neurologic:  Alert and  oriented x4;  grossly normal neurologically. Skin:  Intact without significant lesions or rashes. Psych: Normal mood and affect.   Assessment: 19-year-old male with history of ulcerative pancolitis diagnosed in 2013 previously followed by [facility] pediatric GI last on Humira every other week and methotrexate weekly, but not on any UC medications for over a year,  presenting today to establish care reporting 1-2 months of abdominal pain with associated bloody diarrhea. Also reports about 7 lb weight loss. Symptoms somewhat similar to prior UC flares, though pain can radiate to his upper abdomen which  is different.  Evaluated in the emergency room in April and was placed on a 10-day course of prednisone which he reports did help with his abdominal pain, but frequent watery diarrhea persisted. No rectal bleeding in 2 days.  Denies fever, chills, nausea, or vomiting.  No NSAIDs.  Last colonoscopy in 2019 with mild chronic active colitis in the descending and sigmoid colon, mild active colitis in the rectum.  On exam today, he has very minimal TTP across the lower abdomen.  Suspect we are dealing with UC flare; however, cannot rule out infectious diarrhea.  We will update labs, check stool studies, and obtain biologic pretreatment labs.  If stool studies are negative, will start budesonide 9 mg daily. Ultimately, he will need to resume daily biologic therapy for ulcerative colitis.  Notably, symptoms are well controlled on Humira and methotrexate.  Previously on Remicade, but developed antibodies and also had urticarial rashes with infusions.   He is also over due for colonoscopy. Will discuss and timing of colonoscopy and maintenance UC meds with Dr. Carver once we rule out infectious diarrhea.    Plan:  1.  CBC, CMP, ESR, sed rate, C. difficile GDH and toxin A/B, GI pathogen panel, hepatitis B surface antigen, hepatitis B surface antibody, hepatitis B core antibody total, hepatitis C antibody, QuantiFERON-TB gold, varicella-zoster IgG.  2.  Dietary adjustments: Low residue/low fiber diet. 4-6 small meals daily. Avoid fried, fatty, greasy, spicy foods. Limit carbonated beverages.  3.  Add protein shakes, boost, Ensure, or other meal replacement twice daily to help maintain weight.  4.  Needs colonoscopy in the near future.  We will determine timing once infectious diarrhea has been ruled out.  5.  Patient/mother request follow-up with Dr. Carver.      Jyren Cerasoli, PA-C [facility] 12/22/2020  

## 2020-12-20 NOTE — Progress Notes (Signed)
Referring Provider: [name], PA-C [facility]) Primary Care Physician:  [facility] Primary Gastroenterologist:  Dr. Abbey Chatters  Chief Complaint  Patient presents with  . Ulcerative Colitis    Had UC for 9 years. Some flares recently. Rectal bleeding    HPI:   Jerry Cordova is a 20 y.o. male presenting today at the request of [name], PA-C [facility]) for ulcerative colitis.   Patient has history of ulcerative pancolitis diagnosed in 2013. Previously followed by pediatric GI at [facility].  He was on Remicade at one point, but he developed antibodies and became symptomatic so he was transitioned to Humira with methotrexate.  Notably, he also had a urticarial rashes with Remicade infusions and this was treated by antihistamines.  It appears patient was last seen by [facility] in May 2020 and was on Humira 40 mg every other week with methotrexate 31m weekly and folate.  Symptoms were doing well at that time and he was advised to continue his current medication regimen.  Patient was seen in the emergency room 11/17/2020 for abdominal pain.  Reported he has not been on any medications for UC in over a year, but felt the symptoms were similar to flares.  CBC remarkable for WBC 17.7.  BMP within normal limits.  He was prescribed Zofran and prednisone 20 mg twice daily x5 days then 1 daily for 5 days.    Today:  Has not been on any medications for ulcerative colitis in over a year.  States he felt very well after stopping his medications until about 1-2 months ago when he began to develop lower abdominal pain and diarrhea with rectal bleeding.  Somewhat similar to prior UC flares.  Pain can radiate to his upper abdomen which is different than prior UC flares.  Pain comes and goes and can be triggered by food.  Can get up to 8/10 in severity.  Does not take anything for abdominal pain.  Reports 1 on prednisone, this did not help his abdominal pain, did not change his  diarrhea.  Currently with 5-10+ BMs daily.  No rectal bleeding in the last 2 days.  No melena. Denies recent antibiotics, hospitalizations, sick contacts, fever, chills, cold or flulike symptoms.  Admits to weight loss.  Usually weighs around 145 pounds.  Occasional lightheadedness with position changes.  Denies nausea, vomiting.  Rare reflux symptoms.  No dysphagia.  No NSAIDs.  Colonoscopy and EGD in 2019: Colonoscopy impression: Moderately active ulcerative colitis; pancolitis with some patulous in the rectum, biopsied.  The examined portion of the ileum was normal s/p biopsy.  TI biopsy was benign, cecal biopsy with quiescent chronic colitis, no active colitis, ascending colon biopsy benign, transverse colon biopsy benign, mild chronic active colitis in the descending and sigmoid colon.  Rectum with mild active colitis, no chronic colitis.   EGD: Normal exam.  Duodenal biopsies with no inflammation or villous blunting.  Gastric biopsies with mild patchy chronic gastritis.  H. pylori negative.  Esophageal biopsies benign.  Past Medical History:  Diagnosis Date  . Anxiety    Is in therapy, not on medication  . Chest wall injury 2010   Hit by baseball, radiography without fracture, discharged home  . [facility]     Past Surgical History:  Procedure Laterality Date  . COLONOSCOPY  12/02/2011    Surgeon: JOletha Blend MD; entire colonic mucosa with marked friability, granularity, edema, and erythema with no  skip lesions or rectal sparing.  Unable to negotiate through the ascending colon to directly visualize the cecum.  Multiple biopsies were taken and all revealed chronic active colitis consistent with IBD.  Marland Kitchen COLONOSCOPY  2019   [facility]- Moderately active UC, pancolitis with some patulous in the rectum.  Pathology: TI biopsy benign, cecal biopsy with quiescent chronic colitis, no active colitis, ascending and transverse colon biopsies benign, mild chronic active colitis in  the descending and sigmoid colon, mild active colitis in the rectum.  . ESOPHAGOGASTRODUODENOSCOPY  2019   [facility]; Normal exam.  Duodenal biopsies with no inflammation or villous blunting.  Gastric biopsies with mild patchy chronic gastritis.  H. pylori negative.  Esophageal biopsies benign.    Current Outpatient Medications  Medication Sig Dispense Refill  . acetaminophen (TYLENOL) 325 MG tablet Take 650 mg by mouth every 6 (six) hours as needed.    Marland Kitchen albuterol (VENTOLIN HFA) 108 (90 Base) MCG/ACT inhaler Inhale 1-2 puffs into the lungs every 6 (six) hours as needed for wheezing or shortness of breath. 1 each 0   No current facility-administered medications for this visit.    Allergies as of 12/22/2020 - Review Complete 12/22/2020  Allergen Reaction Noted  . Penicillins Hives and Rash 11/28/2011  . Remicade [infliximab] Hives 12/22/2020    Family History  Problem Relation Age of Onset  . Diabetes Other        Maternal family  . Asthma Other        maternal family  . Cancer Other        maternal family  . Heart disease Other        Maternal GGF later in life   . Ulcerative colitis Other        Unrelated step maternal GF   . GI problems Cousin        Mother began asking paternal family about GI problems and learned of 2 young cousins with "bowel problems".   . Colon cancer Neg Hx     Social History   Socioeconomic History  . Marital status: Single    Spouse name: Not on file  . Number of children: Not on file  . Years of education: Not on file  . Highest education level: Not on file  Occupational History  . Not on file  Tobacco Use  . Smoking status: Passive Smoke Exposure - Never Smoker  . Smokeless tobacco: Current User    Types: Chew  Vaping Use  . Vaping Use: Every day  Substance and Sexual Activity  . Alcohol use: No  . Drug use: No  . Sexual activity: Never  Other Topics Concern  . Not on file  Social History Narrative  . Not on file   Social  Determinants of Health   Financial Resource Strain: Not on file  Food Insecurity: Not on file  Transportation Needs: Not on file  Physical Activity: Not on file  Stress: Not on file  Social Connections: Not on file  Intimate Partner Violence: Not on file    Review of Systems: Gen: See HPI  CV: Denies chest pain or palpitations. Resp: Denies shortness of breath or cough. GI: See HPI. GU : Denies urinary burning, urinary frequency, urinary hesitancy MS: Denies joint pain Derm: Denies rash Psych: History of anxiety. Heme: See HPI  Physical Exam: BP 126/79   Pulse 76   Temp (!) 96.9 F (36.1 C) (Temporal)   Ht _0  (1.854 m)   Wt 138 lb 12.8 oz (  63 kg)   BMI 18.31 kg/m  General:   Alert and oriented. Pleasant and cooperative. Well-developed. Thin.  Head:  Normocephalic and atraumatic. Eyes:  Without icterus, sclera clear and conjunctiva pink.  Ears:  Normal auditory acuity. Lungs:  Clear to auscultation bilaterally. No wheezes, rales, or rhonchi. No distress.  Heart:  S1, S2 present without murmurs appreciated.  Abdomen:  +BS, soft, and non-distended. Minimal TTP across the lower abdomen. No HSM noted. No guarding or rebound. No masses appreciated.  Rectal:  Deferred  Msk:  Symmetrical without gross deformities. Normal posture. Extremities:  Without edema. Neurologic:  Alert and  oriented x4;  grossly normal neurologically. Skin:  Intact without significant lesions or rashes. Psych: Normal mood and affect.   Assessment: 20 year old male with history of ulcerative pancolitis diagnosed in 2013 previously followed by [facility] pediatric GI last on Humira every other week and methotrexate weekly, but not on any UC medications for over a year,  presenting today to establish care reporting 1-2 months of abdominal pain with associated bloody diarrhea. Also reports about 7 lb weight loss. Symptoms somewhat similar to prior UC flares, though pain can radiate to his upper abdomen which  is different.  Evaluated in the emergency room in April and was placed on a 10-day course of prednisone which he reports did help with his abdominal pain, but frequent watery diarrhea persisted. No rectal bleeding in 2 days.  Denies fever, chills, nausea, or vomiting.  No NSAIDs.  Last colonoscopy in 2019 with mild chronic active colitis in the descending and sigmoid colon, mild active colitis in the rectum.  On exam today, he has very minimal TTP across the lower abdomen.  Suspect we are dealing with UC flare; however, cannot rule out infectious diarrhea.  We will update labs, check stool studies, and obtain biologic pretreatment labs.  If stool studies are negative, will start budesonide 9 mg daily. Ultimately, he will need to resume daily biologic therapy for ulcerative colitis.  Notably, symptoms are well controlled on Humira and methotrexate.  Previously on Remicade, but developed antibodies and also had urticarial rashes with infusions.   He is also over due for colonoscopy. Will discuss and timing of colonoscopy and maintenance UC meds with Dr. Abbey Chatters once we rule out infectious diarrhea.    Plan:  1.  CBC, CMP, ESR, sed rate, C. difficile GDH and toxin A/B, GI pathogen panel, hepatitis B surface antigen, hepatitis B surface antibody, hepatitis B core antibody total, hepatitis C antibody, QuantiFERON-TB gold, varicella-zoster IgG.  2.  Dietary adjustments: Low residue/low fiber diet. 4-6 small meals daily. Avoid fried, fatty, greasy, spicy foods. Limit carbonated beverages.  3.  Add protein shakes, boost, Ensure, or other meal replacement twice daily to help maintain weight.  4.  Needs colonoscopy in the near future.  We will determine timing once infectious diarrhea has been ruled out.  5.  Patient/mother request follow-up with Dr. Abbey Chatters.      Aliene Altes, PA-C [facility] 12/22/2020

## 2020-12-22 ENCOUNTER — Encounter: Payer: Self-pay | Admitting: Gastroenterology

## 2020-12-22 ENCOUNTER — Other Ambulatory Visit: Payer: Self-pay

## 2020-12-22 ENCOUNTER — Ambulatory Visit (INDEPENDENT_AMBULATORY_CARE_PROVIDER_SITE_OTHER): Payer: Medicaid Other | Admitting: Gastroenterology

## 2020-12-22 VITALS — BP 126/79 | HR 76 | Temp 96.9°F | Ht 73.0 in | Wt 138.8 lb

## 2020-12-22 DIAGNOSIS — R1030 Lower abdominal pain, unspecified: Secondary | ICD-10-CM | POA: Insufficient documentation

## 2020-12-22 DIAGNOSIS — K51011 Ulcerative (chronic) pancolitis with rectal bleeding: Secondary | ICD-10-CM | POA: Insufficient documentation

## 2020-12-22 DIAGNOSIS — R197 Diarrhea, unspecified: Secondary | ICD-10-CM | POA: Diagnosis not present

## 2020-12-22 NOTE — Patient Instructions (Addendum)
Please have labs and stool studies completed at [facility].  We will call you with results and further recommendations.  As we discussed, you may try following a low residue/low fiber diet for now. Try eating 4-6 small meals daily. I also recommend you avoid fried, fatty, greasy, spicy foods. Limit carbonated beverages.  You may try adding protein shakes, Ensure, Boost, or other meal replacement drink twice daily to help maintain your weight.   You are due for colonoscopy. We will determine timing of this after labs are completed.   Aliene Altes, PA-C [facility]

## 2020-12-24 LAB — COMPLETE METABOLIC PANEL WITH GFR
AG Ratio: 1 (calc) (ref 1.0–2.5)
ALT: 12 U/L (ref 8–46)
AST: 17 U/L (ref 12–32)
Albumin: 3.9 g/dL (ref 3.6–5.1)
Alkaline phosphatase (APISO): 87 U/L (ref 46–169)
BUN/Creatinine Ratio: 7 (calc) (ref 6–22)
BUN: 6 mg/dL — ABNORMAL LOW (ref 7–20)
CO2: 29 mmol/L (ref 20–32)
Calcium: 9.6 mg/dL (ref 8.9–10.4)
Chloride: 103 mmol/L (ref 98–110)
Creat: 0.9 mg/dL (ref 0.60–1.26)
GFR, Est African American: 143 mL/min/{1.73_m2} (ref >=60)
GFR, Est Non African American: 123 mL/min/{1.73_m2} (ref >=60)
Globulin: 3.8 g/dL (calc) — ABNORMAL HIGH (ref 2.1–3.5)
Glucose, Bld: 81 mg/dL (ref 65–99)
Potassium: 4.4 mmol/L (ref 3.8–5.1)
Sodium: 138 mmol/L (ref 135–146)
Total Bilirubin: 0.3 mg/dL (ref 0.2–1.1)
Total Protein: 7.7 g/dL (ref 6.3–8.2)

## 2020-12-24 LAB — SEDIMENTATION RATE: Sed Rate: 17 mm/h — ABNORMAL HIGH (ref 0–15)

## 2020-12-24 LAB — CBC WITH DIFFERENTIAL/PLATELET
Absolute Monocytes: 876 cells/uL (ref 200–950)
Basophils Absolute: 236 cells/uL — ABNORMAL HIGH (ref 0–200)
Basophils Relative: 1.7 %
Eosinophils Absolute: 1376 cells/uL — ABNORMAL HIGH (ref 15–500)
Eosinophils Relative: 9.9 %
HCT: 45.1 % (ref 38.5–50.0)
Hemoglobin: 15.6 g/dL (ref 13.2–17.1)
Lymphs Abs: 4406 cells/uL — ABNORMAL HIGH (ref 850–3900)
MCH: 31.7 pg (ref 27.0–33.0)
MCHC: 34.6 g/dL (ref 32.0–36.0)
MCV: 91.7 fL (ref 80.0–100.0)
MPV: 10.4 fL (ref 7.5–12.5)
Monocytes Relative: 6.3 %
Neutro Abs: 7006 cells/uL (ref 1500–7800)
Neutrophils Relative %: 50.4 %
Platelets: 470 10*3/uL — ABNORMAL HIGH (ref 140–400)
RBC: 4.92 10*6/uL (ref 4.20–5.80)
RDW: 13.7 % (ref 11.0–15.0)
Total Lymphocyte: 31.7 %
WBC: 13.9 10*3/uL — ABNORMAL HIGH (ref 3.8–10.8)

## 2020-12-24 LAB — HEPATITIS C ANTIBODY
Hepatitis C Ab: NONREACTIVE
SIGNAL TO CUT-OFF: 0.05 (ref <1.00)

## 2020-12-24 LAB — HEPATITIS B SURFACE ANTIGEN: Hepatitis B Surface Ag: NONREACTIVE

## 2020-12-24 LAB — QUANTIFERON-TB GOLD PLUS
Mitogen-NIL: >10 IU/mL
NIL: 0.03 IU/mL
QuantiFERON-TB Gold Plus: NEGATIVE
TB1-NIL: 0.01 IU/mL
TB2-NIL: 0.01 IU/mL

## 2020-12-24 LAB — HEPATITIS B SURFACE ANTIBODY,QUALITATIVE: Hep B S Ab: NONREACTIVE

## 2020-12-24 LAB — C-REACTIVE PROTEIN: CRP: 4.7 mg/L (ref <8.0)

## 2020-12-24 LAB — HEPATITIS B CORE ANTIBODY, TOTAL: Hep B Core Total Ab: NONREACTIVE

## 2020-12-24 LAB — VARICELLA ZOSTER ANTIBODY, IGG: Varicella IgG: <135 index — ABNORMAL LOW

## 2020-12-24 NOTE — Progress Notes (Signed)
Cc'ed to pcp °

## 2020-12-24 NOTE — Progress Notes (Signed)
Dear Jerry Cordova,   Your labs look good so far. Everything is within normal limits. Your white blood cells are elevated but that is due to you coming off recent Prednisone Rx. Inflammatory marker sed rate is elevated at 17 ( some inflammation in the body).    We are still waiting on stool studies and a few additional labs. Further recommendations to follow. If you have any questions or concerns please call our office.    Thank you, Oleh Genin, CMA

## 2020-12-29 LAB — GASTROINTESTINAL PATHOGEN PANEL PCR
C. difficile Tox A/B, PCR: NOT DETECTED
Campylobacter, PCR: NOT DETECTED
Cryptosporidium, PCR: NOT DETECTED
E coli (ETEC) LT/ST PCR: NOT DETECTED
E coli (STEC) stx1/stx2, PCR: NOT DETECTED
E coli 0157, PCR: NOT DETECTED
Giardia lamblia, PCR: NOT DETECTED
Norovirus, PCR: NOT DETECTED
Rotavirus A, PCR: NOT DETECTED
Salmonella, PCR: NOT DETECTED
Shigella, PCR: NOT DETECTED

## 2020-12-29 LAB — C. DIFFICILE GDH AND TOXIN A/B
GDH ANTIGEN: NOT DETECTED
MICRO NUMBER:: 11890833
SPECIMEN QUALITY:: ADEQUATE
TOXIN A AND B: NOT DETECTED

## 2020-12-31 ENCOUNTER — Other Ambulatory Visit: Payer: Self-pay | Admitting: Gastroenterology

## 2020-12-31 DIAGNOSIS — K51011 Ulcerative (chronic) pancolitis with rectal bleeding: Secondary | ICD-10-CM

## 2020-12-31 MED ORDER — BUDESONIDE 3 MG PO CPEP: 9.0000 mg | ORAL_CAPSULE | Freq: Every day | ORAL | 0 refills | Status: DC

## 2021-01-01 ENCOUNTER — Encounter: Payer: Self-pay | Admitting: *Deleted

## 2021-01-06 ENCOUNTER — Telehealth: Payer: Self-pay

## 2021-01-06 ENCOUNTER — Telehealth: Payer: Self-pay | Admitting: Internal Medicine

## 2021-01-06 NOTE — Telephone Encounter (Signed)
Spoke with patient.  He reports Sunday, he began to notice it was taking a while for his food to go down his esophagus.  Since that time, symptoms have been worsening.  Now with trouble with foods, liquids, and his saliva.  States items get hung in his mid chest with associated discomfort at that time.  No regurgitation.  States items will eventually pass.  Denies reflux, cold or flulike symptoms, shortness of breath.  He needs EGD for further evaluation.  Notably, after 5/31, his insurance will no longer cover visits with [facility].  I have discussed with Dr. Abbey Chatters who is agreeable to add patient onto his schedule either Friday or Tuesday.  [facility] Clinical Pool:  Please arrange EGD +/- dilation with propofol with Dr. Abbey Chatters either Friday 5/27 or Tuesday 5/31.   ASA II

## 2021-01-06 NOTE — Telephone Encounter (Signed)
Called pt, procedure scheduled for 01/09/21 at 2:45pm. COVID test 01/08/21 at 8:05am. Gave pt EGD instructions on phone. Advised him to be NPO after 10:00am 01/09/21. Orders entered.  Unable to submit PA for EGD/DIL via [facility] website after 2 attempts.

## 2021-01-06 NOTE — Telephone Encounter (Signed)
Pt LMOM that he needed an ASAP procedure to stretch his throat. He was seen in the office a few weeks ago. [phone]

## 2021-01-06 NOTE — Telephone Encounter (Signed)
Pt called and stated that he's having the sensation of something being stuck in his throat. States from his own saliva, liquids, and food feels like it is stuck. States a little indigestion. No N/V present. Sensation in his chest he describes it as being a 8 or 9. States very painful.

## 2021-01-06 NOTE — Telephone Encounter (Signed)
Returned the pt's call , went to vm . LMOVM for the pt to call back

## 2021-01-06 NOTE — Telephone Encounter (Signed)
error 

## 2021-01-07 NOTE — Telephone Encounter (Addendum)
PA submitted for EGD/DIL via [facility] website. Notification/prior authorization reference# [id number]. Case marked as urgent.

## 2021-01-07 NOTE — Telephone Encounter (Signed)
Pt called office and LMOVM requesting to speak to Aliene Altes PA. Said last time he went under they thought he had seizure and he wants her to know before his upcoming procedure scheduled for 01/09/21.

## 2021-01-07 NOTE — Telephone Encounter (Signed)
Called and informed pt.  

## 2021-01-07 NOTE — Telephone Encounter (Signed)
Noted. I suspect he will do ok as this is not general anesthesia. However, will route to Dr. Abbey Chatters to make him aware. Patient can also relay this information to anesthesia when he talks with them the day of his procedure.   Dr. Abbey Chatters, Juluis Rainier

## 2021-01-08 ENCOUNTER — Other Ambulatory Visit: Payer: Self-pay

## 2021-01-08 ENCOUNTER — Other Ambulatory Visit (HOSPITAL_COMMUNITY)
Admission: RE | Admit: 2021-01-08 | Discharge: 2021-01-08 | Disposition: A | Discharge status: Home / Self Care | Payer: Medicaid Other | Source: Ambulatory Visit | Attending: Internal Medicine | Admitting: Internal Medicine

## 2021-01-08 DIAGNOSIS — Z01812 Encounter for preprocedural laboratory examination: Secondary | ICD-10-CM | POA: Diagnosis present

## 2021-01-08 DIAGNOSIS — Z20822 Contact with and (suspected) exposure to covid-19: Secondary | ICD-10-CM | POA: Diagnosis not present

## 2021-01-08 LAB — SARS CORONAVIRUS 2 (TAT 6-24 HRS): SARS Coronavirus 2: NEGATIVE

## 2021-01-08 NOTE — Telephone Encounter (Signed)
PA pending per [facility] website. Called [employer], spoke to [place] (provider service advocate). I had marked case "urgent" but case was showing as routine. [place] opened new case and referenced date case was created yesterday. She accelerated and expedited case. Can take up to 72 hours for decision. Auth ref# [id number]. Call ref# [id number].

## 2021-01-08 NOTE — Telephone Encounter (Signed)
EGD/DIL approved. PA# [id number], valid 01/09/21-04/09/21.

## 2021-01-09 ENCOUNTER — Other Ambulatory Visit: Payer: Self-pay

## 2021-01-09 ENCOUNTER — Encounter (HOSPITAL_COMMUNITY): Payer: Self-pay

## 2021-01-09 ENCOUNTER — Ambulatory Visit (HOSPITAL_COMMUNITY): Payer: Medicaid Other | Admitting: Anesthesiology

## 2021-01-09 ENCOUNTER — Ambulatory Visit (HOSPITAL_COMMUNITY)
Admission: RE | Admit: 2021-01-09 | Discharge: 2021-01-09 | Disposition: A | Discharge status: Home / Self Care | Payer: Medicaid Other | Attending: Internal Medicine | Admitting: Internal Medicine

## 2021-01-09 ENCOUNTER — Telehealth: Payer: Self-pay | Admitting: Internal Medicine

## 2021-01-09 ENCOUNTER — Encounter (HOSPITAL_COMMUNITY)
Admission: RE | Disposition: A | Discharge status: Home / Self Care | Payer: Self-pay | Source: Home / Self Care | Attending: Internal Medicine

## 2021-01-09 DIAGNOSIS — Z888 Allergy status to other drugs, medicaments and biological substances status: Secondary | ICD-10-CM | POA: Insufficient documentation

## 2021-01-09 DIAGNOSIS — Z880 Allergy status to penicillin: Secondary | ICD-10-CM | POA: Diagnosis not present

## 2021-01-09 DIAGNOSIS — Z68.41 Body mass index (BMI) pediatric, 5th percentile to less than 85th percentile for age: Secondary | ICD-10-CM | POA: Diagnosis not present

## 2021-01-09 DIAGNOSIS — B3781 Candidal esophagitis: Secondary | ICD-10-CM

## 2021-01-09 DIAGNOSIS — K2950 Unspecified chronic gastritis without bleeding: Secondary | ICD-10-CM | POA: Diagnosis not present

## 2021-01-09 DIAGNOSIS — R1310 Dysphagia, unspecified: Secondary | ICD-10-CM

## 2021-01-09 DIAGNOSIS — K51011 Ulcerative (chronic) pancolitis with rectal bleeding: Secondary | ICD-10-CM | POA: Diagnosis not present

## 2021-01-09 DIAGNOSIS — R634 Abnormal weight loss: Secondary | ICD-10-CM | POA: Diagnosis not present

## 2021-01-09 DIAGNOSIS — K2970 Gastritis, unspecified, without bleeding: Secondary | ICD-10-CM

## 2021-01-09 DIAGNOSIS — K2100 Gastro-esophageal reflux disease with esophagitis, without bleeding: Secondary | ICD-10-CM | POA: Diagnosis not present

## 2021-01-09 HISTORY — PX: ESOPHAGEAL BRUSHING: SHX6842

## 2021-01-09 HISTORY — PX: BIOPSY: SHX5522

## 2021-01-09 HISTORY — PX: ESOPHAGOGASTRODUODENOSCOPY (EGD) WITH PROPOFOL: SHX5813

## 2021-01-09 HISTORY — PX: BALLOON DILATION: SHX5330

## 2021-01-09 LAB — KOH PREP

## 2021-01-09 SURGERY — ESOPHAGOGASTRODUODENOSCOPY (EGD) WITH PROPOFOL
Anesthesia: General

## 2021-01-09 MED ORDER — STERILE WATER FOR IRRIGATION IR SOLN
Status: DC | PRN
  Administered 2021-01-09: 1.5 mL

## 2021-01-09 MED ORDER — FLUCONAZOLE 200 MG PO TABS: ORAL_TABLET | ORAL | 0 refills | Status: DC

## 2021-01-09 MED ORDER — PROPOFOL 10 MG/ML IV BOLUS
INTRAVENOUS | Status: DC | PRN
  Administered 2021-01-09 (×2): 50 mg via INTRAVENOUS
  Administered 2021-01-09: 100 mg via INTRAVENOUS
  Administered 2021-01-09: 50 mg via INTRAVENOUS

## 2021-01-09 MED ORDER — LIDOCAINE HCL (CARDIAC) PF 100 MG/5ML IV SOSY
PREFILLED_SYRINGE | INTRAVENOUS | Status: DC | PRN
  Administered 2021-01-09: 60 mg via INTRATRACHEAL

## 2021-01-09 MED ORDER — OMEPRAZOLE 20 MG PO CPDR: 20.0000 mg | DELAYED_RELEASE_CAPSULE | Freq: Every day | ORAL | 5 refills | Status: DC

## 2021-01-09 MED ORDER — LACTATED RINGERS IV SOLN: INTRAVENOUS | Status: DC

## 2021-01-09 NOTE — Anesthesia Postprocedure Evaluation (Signed)
Anesthesia Post Note  Patient: Jerry Cordova  Procedure(s) Performed: ESOPHAGOGASTRODUODENOSCOPY (EGD) WITH PROPOFOL (N/A ) BALLOON DILATION (N/A ) BIOPSY ESOPHAGEAL BRUSHING  Patient location during evaluation: Endoscopy Anesthesia Type: General Level of consciousness: awake and alert and oriented Pain management: pain level controlled Vital Signs Assessment: post-procedure vital signs reviewed and stable Respiratory status: spontaneous breathing and respiratory function stable Cardiovascular status: blood pressure returned to baseline and stable Postop Assessment: no apparent nausea or vomiting Anesthetic complications: no   No complications documented.   Last Vitals:  Vitals:   01/09/21 0933 01/09/21 0936  BP: (!) 100/53 (!) 106/43  Pulse: 80 74  Resp: 20 (!) 23  Temp: 36.7 C   SpO2: 97% 98%    Last Pain:  Vitals:   01/09/21 0936  TempSrc:   PainSc: 0-No pain                 [name]

## 2021-01-09 NOTE — Interval H&P Note (Signed)
History and Physical Interval Note:  01/09/2021 8:01 AM  Jerry Cordova  has presented today for surgery, with the diagnosis of dysphagia.  The various methods of treatment have been discussed with the patient and family. After consideration of risks, benefits and other options for treatment, the patient has consented to  Procedure(s) with comments: ESOPHAGOGASTRODUODENOSCOPY (EGD) WITH PROPOFOL (N/A) - 2:45pm BALLOON DILATION (N/A) as a surgical intervention.  The patient's history has been reviewed, patient examined, no change in status, stable for surgery.  I have reviewed the patient's chart and labs.  Questions were answered to the patient's satisfaction.     Eloise Harman

## 2021-01-09 NOTE — Transfer of Care (Signed)
Immediate Anesthesia Transfer of Care Note  Patient: Jerry Cordova  Procedure(s) Performed: ESOPHAGOGASTRODUODENOSCOPY (EGD) WITH PROPOFOL (N/A ) BALLOON DILATION (N/A ) BIOPSY ESOPHAGEAL BRUSHING  Patient Location: PACU  Anesthesia Type:General  Level of Consciousness: drowsy  Airway & Oxygen Therapy: Patient Spontanous Breathing  Post-op Assessment: Report given to RN and Post -op Vital signs reviewed and stable  Post vital signs: Reviewed and stable  Last Vitals:  Vitals Value Taken Time  BP    Temp    Pulse    Resp    SpO2      Last Pain:  Vitals:   01/09/21 0729  PainSc: 4       Patients Stated Pain Goal: 7 (67/01/41 0301)  Complications: No complications documented.

## 2021-01-09 NOTE — Telephone Encounter (Signed)
Patient has candidal esophagitis. Will send in 14 day course of Diflucan to [facility]. I called and spoke to him in regards to this. He understands.

## 2021-01-09 NOTE — Discharge Instructions (Signed)
EGD Discharge instructions Please read the instructions outlined below and refer to this sheet in the next few weeks. These discharge instructions provide you with general information on caring for yourself after you leave the hospital. Your doctor may also give you specific instructions. While your treatment has been planned according to the most current medical practices available, unavoidable complications occasionally occur. If you have any problems or questions after discharge, please call your doctor. ACTIVITY  You may resume your regular activity but move at a slower pace for the next 24 hours.   Take frequent rest periods for the next 24 hours.   Walking will help expel (get rid of) the air and reduce the bloated feeling in your abdomen.   No driving for 24 hours (because of the anesthesia (medicine) used during the test).   You may shower.   Do not sign any important legal documents or operate any machinery for 24 hours (because of the anesthesia used during the test).  NUTRITION  Drink plenty of fluids.   You may resume your normal diet.   Begin with a light meal and progress to your normal diet.   Avoid alcoholic beverages for 24 hours or as instructed by your caregiver.  MEDICATIONS  You may resume your normal medications unless your caregiver tells you otherwise.  WHAT YOU CAN EXPECT TODAY  You may experience abdominal discomfort such as a feeling of fullness or "gas" pains.  FOLLOW-UP  Your doctor will discuss the results of your test with you.  SEEK IMMEDIATE MEDICAL ATTENTION IF ANY OF THE FOLLOWING OCCUR:  Excessive nausea (feeling sick to your stomach) and/or vomiting.   Severe abdominal pain and distention (swelling).   Trouble swallowing.   Temperature over 101 F (37.8 C).   Rectal bleeding or vomiting of blood.   EGD revealed slight amount inflammation in the esophagus and stomach.  I took biopsies of both.  Also took samples of your esophagus to  rule out infection with yeast.  You had a slight narrowing of your distal esophagus so I did perform dilation.  Hopefully this helps with your swallowing.  I want you to take omeprazole daily.  I sent a prescription to your pharmacy.  Await pathology results, my office will contact you next week.  Follow-up with GI to discuss your UC.   I hope you have a great rest of your week!  Elon Alas. Abbey Chatters, D.O. Gastroenterology and Hepatology         Monitored Anesthesia Care, Care After This sheet gives you information about how to care for yourself after your procedure. Your health care provider may also give you more specific instructions. If you have problems or questions, contact your health care provider. What can I expect after the procedure? After the procedure, it is common to have:  Tiredness.  Forgetfulness about what happened after the procedure.  Impaired judgment for important decisions.  Nausea or vomiting.  Some difficulty with balance. Follow these instructions at home: For the time period you were told by your health care provider:  Rest as needed.  Do not participate in activities where you could fall or become injured.  Do not drive or use machinery.  Do not drink alcohol.  Do not take sleeping pills or medicines that cause drowsiness.  Do not make important decisions or sign legal documents.  Do not take care of children on your own.      Eating and drinking  Follow the diet that is recommended  by your health care provider.  Drink enough fluid to keep your urine pale yellow.  If you vomit: ? Drink water, juice, or soup when you can drink without vomiting. ? Make sure you have little or no nausea before eating solid foods. General instructions  Have a responsible adult stay with you for the time you are told. It is important to have someone help care for you until you are awake and alert.  Take over-the-counter and prescription medicines only as  told by your health care provider.  If you have sleep apnea, surgery and certain medicines can increase your risk for breathing problems. Follow instructions from your health care provider about wearing your sleep device: ? Anytime you are sleeping, including during daytime naps. ? While taking prescription pain medicines, sleeping medicines, or medicines that make you drowsy.  Avoid smoking.  Keep all follow-up visits as told by your health care provider. This is important. Contact a health care provider if:  You keep feeling nauseous or you keep vomiting.  You feel light-headed.  You are still sleepy or having trouble with balance after 24 hours.  You develop a rash.  You have a fever.  You have redness or swelling around the IV site. Get help right away if:  You have trouble breathing.  You have new-onset confusion at home. Summary  For several hours after your procedure, you may feel tired. You may also be forgetful and have poor judgment.  Have a responsible adult stay with you for the time you are told. It is important to have someone help care for you until you are awake and alert.  Rest as told. Do not drive or operate machinery. Do not drink alcohol or take sleeping pills.  Get help right away if you have trouble breathing, or if you suddenly become confused. This information is not intended to replace advice given to you by your health care provider. Make sure you discuss any questions you have with your health care provider. Document Revised: 04/17/2020 Document Reviewed: 07/05/2019 Elsevier Patient Education  2021 [facility] \

## 2021-01-09 NOTE — Anesthesia Preprocedure Evaluation (Addendum)
Anesthesia Evaluation  Patient identified by MRN, date of birth, ID band Patient awake    Reviewed: Allergy & Precautions, NPO status , Patient's Chart, lab work & pertinent test results  History of Anesthesia Complications (+) history of anesthetic complications (seizures like symptoms after anesthesia)  Airway Mallampati: I  TM Distance: >3 FB Neck ROM: Full    Dental  (+) Dental Advisory Given, Teeth Intact, Chipped,    Pulmonary shortness of breath, Current Smoker (smokless tobacco),    Pulmonary exam normal breath sounds clear to auscultation       Cardiovascular Exercise Tolerance: Good negative cardio ROS Normal cardiovascular exam Rhythm:Regular Rate:Normal     Neuro/Psych Seizures - (seizure like symptoms after anesthesia, neurologist evaluated and cleared),  PSYCHIATRIC DISORDERS Anxiety    GI/Hepatic Neg liver ROS, PUD, Medicated,Ulcerative colitis    Endo/Other  negative endocrine ROS  Renal/GU negative Renal ROS     Musculoskeletal  (+) Arthritis  (back pain),   Abdominal   Peds  Hematology negative hematology ROS (+)   Anesthesia Other Findings   Reproductive/Obstetrics negative OB ROS                            Anesthesia Physical Anesthesia Plan  ASA: II  Anesthesia Plan: General   Post-op Pain Management:    Induction: Intravenous  PONV Risk Score and Plan: Propofol infusion and TIVA  Airway Management Planned: Nasal Cannula and Natural Airway  Additional Equipment:   Intra-op Plan:   Post-operative Plan:   Informed Consent: I have reviewed the patients History and Physical, chart, labs and discussed the procedure including the risks, benefits and alternatives for the proposed anesthesia with the patient or authorized representative who has indicated his/her understanding and acceptance.     Dental advisory given  Plan Discussed with: CRNA and  Surgeon  Anesthesia Plan Comments:         Anesthesia Quick Evaluation

## 2021-01-09 NOTE — Op Note (Signed)
[facility] Patient Name: Jerry Cordova Procedure Date: 01/09/2021 9:09 AM MRN: [id number] Date of Birth: 08/08/2001 Attending MD: Elon Alas. Abbey Chatters DO CSN: [id number] Age: 20 Admit Type: Outpatient Procedure:                Upper GI endoscopy Indications:              Dysphagia Providers:                Elon Alas. Abbey Chatters, DO, Charlsie Quest. Theda Sers RN, RN,                            Wynonia Musty Tech, Technician Referring MD:              Medicines:                See the Anesthesia note for documentation of the                            administered medications Complications:            No immediate complications. Estimated Blood Loss:     Estimated blood loss was minimal. Procedure:                Pre-Anesthesia Assessment:                           - The anesthesia plan was to use monitored                            anesthesia care (MAC).                           After obtaining informed consent, the endoscope was                            passed under direct vision. Throughout the                            procedure, the patient's blood pressure, pulse, and                            oxygen saturations were monitored continuously. The                            [id number]-H190 ([id number]) was introduced through the                            mouth, and advanced to the second part of duodenum.                            The upper GI endoscopy was accomplished without                            difficulty. The patient tolerated the procedure                            well. Scope In: 9:20:53 AM  Scope Out: 9:28:20 AM Total Procedure Duration: 0 hours 7 minutes 27 seconds  Findings:      Non-severe esophagitis with no bleeding was found in the upper third of       the esophagus. Cells for cytology were obtained by brushing.      Biopsies were taken with a cold forceps in the middle third of the       esophagus for histology.      No obvious stricture identified. Preparations  were made for empiric       dilation. A TTS dilator was passed through the scope. Dilation with an       18-19-20 mm balloon dilator was performed to 20 mm. Dilation was       performed with a mild resistance at 20 mm. Estimated blood loss was none.      Patchy mild inflammation characterized by erythema was found in the       gastric fundus and in the gastric body. Biopsies were taken with a cold       forceps for Helicobacter pylori testing.      The duodenal bulb, first portion of the duodenum and second portion of       the duodenum were normal. Impression:               - Non-severe candidiasis esophagitis with no                            bleeding. Cells for cytology obtained.                           - Gastritis. Biopsied.                           - Normal duodenal bulb, first portion of the                            duodenum and second portion of the duodenum.                           - Biopsies were taken with a cold forceps for                            histology in the middle third of the esophagus. Moderate Sedation:      Per Anesthesia Care Recommendation:           - Patient has a contact number available for                            emergencies. The signs and symptoms of potential                            delayed complications were discussed with the                            patient. Return to normal activities tomorrow.                            Written discharge instructions were provided to the  patient.                           - Resume previous diet.                           - Continue present medications.                           - Await pathology results.                           - Return to GI clinic at the next available                            appointment to discuss UC.                           - Use a proton pump inhibitor PO daily.                           - Treat for candidal esophagitis if cytology                             positive. Procedure Code(s):        --- Professional ---                           [phone], Esophagogastroduodenoscopy, flexible,                            transoral; with biopsy, single or multiple Diagnosis Code(s):        --- Professional ---                           B37.81, Candidal esophagitis                           K29.70, Gastritis, unspecified, without bleeding                           R13.10, Dysphagia, unspecified CPT copyright 2019 American Medical Association. All rights reserved. The codes documented in this report are preliminary and upon coder review may  be revised to meet current compliance requirements. Elon Alas. Abbey Chatters, DO [place] Abbey Chatters, DO 01/09/2021 9:32:03 AM This report has been signed electronically. Number of Addenda: 0

## 2021-01-14 LAB — SURGICAL PATHOLOGY

## 2021-01-19 ENCOUNTER — Encounter (HOSPITAL_COMMUNITY): Payer: Self-pay | Admitting: Internal Medicine

## 2021-01-28 ENCOUNTER — Telehealth: Payer: Self-pay | Admitting: Internal Medicine

## 2021-01-28 NOTE — Telephone Encounter (Signed)
noted 

## 2021-01-28 NOTE — Telephone Encounter (Signed)
Pt called to say that he was going to continue seeing [place]. He thought his insurance wouldn't cover [place], but he said it does. I told him I would let the nurse know.

## 2021-01-29 ENCOUNTER — Other Ambulatory Visit: Payer: Self-pay | Admitting: Gastroenterology

## 2021-01-29 ENCOUNTER — Telehealth: Payer: Self-pay | Admitting: Internal Medicine

## 2021-01-29 DIAGNOSIS — K51011 Ulcerative (chronic) pancolitis with rectal bleeding: Secondary | ICD-10-CM

## 2021-01-29 MED ORDER — BUDESONIDE 3 MG PO CPEP: 9.0000 mg | ORAL_CAPSULE | Freq: Every day | ORAL | 0 refills | Status: DC

## 2021-01-29 NOTE — Telephone Encounter (Signed)
Phoned the pt back, he does have the Rx that was mailed to him and I advised him that the series has to be completed before he can start Humira. Advised to go to his PCP to have this done. Advised of Rx being refilled. The pt stating he has been doing great. No symptoms at all. (He actually sounded excited). Agrees to contact us once he has completed his end.

## 2021-01-29 NOTE — Telephone Encounter (Signed)
Noted. I am glad he is doing well. Hopefully we can get him started back on Humira in the near future as Budesonide is not a long term medication. He is currently scheduled for follow-up in late September. I would like to try to get him in by mid August if possible. Currently our schedules are full unfortunately, but Dr. Abbey Chatters may have something open in August.   Stacey: Can we try to get patient scheduled to see Dr. Abbey Chatters in early to mid August. If he doesn't have any openings, can schedule with me in August if something opens up.

## 2021-01-29 NOTE — Telephone Encounter (Signed)
Returned the pt's call and was advised that the pt only have 2 doses of his Budesonide left, but the pt's main reason for calling was to see if you were going to put him back on Humira. States he wads on it before but not through [place].( He had been referred out at that time). This pt also phoned yesterday to advise [place] that he has insurance now that will cover his visits here. Please advise.

## 2021-01-29 NOTE — Telephone Encounter (Signed)
He needs to be vaccinated against Chickenpox (Varicella vaccine) prior to starting Humira. Has he started the Varicella vaccine series yet? We mailed the prescription for Varicella and Hep B vaccines to him.   I will refill his budesonide.   How is he doing? Abdominal pain, diarrhea, rectal bleeding?

## 2021-01-29 NOTE — Telephone Encounter (Signed)
[phone]   please call patient about his Humira, needs to know if Cyril Mourning is still trying to get him on that medication

## 2021-01-30 NOTE — Telephone Encounter (Signed)
noted 

## 2021-02-02 ENCOUNTER — Encounter: Payer: Self-pay | Admitting: Internal Medicine

## 2021-02-04 ENCOUNTER — Telehealth: Payer: Self-pay | Admitting: Internal Medicine

## 2021-02-04 NOTE — Telephone Encounter (Signed)
Pt's mother called to say that patient has had his Hep B and Chicken Pox vaccines and wanted Aliene Altes, PA to know. I told her that the patient is scheduled 2 different OV with [place] and we needed to know which OV date he wanted to keep so we can cancel the other. She said she would have him call to decide that.

## 2021-02-05 NOTE — Telephone Encounter (Signed)
Pt and advised the pt to keep his appt with Dr. Abbey Chatters on 08/03 and he agreed. I took out the other appt he had scheduled with you. The pt stated when he spoke with his PCP he was advised by him that he had taken all of the vaccines needed. I advised the pt to have his PCP fax [place] a list of his vaccinations that he has had. Pt was also given the fax number to give his PCP. I advised the pt once we receive information we will contact him.

## 2021-02-05 NOTE — Telephone Encounter (Signed)
Jerry Cordova can you please request these records for vaccine information from PCP

## 2021-02-05 NOTE — Telephone Encounter (Signed)
Noted. Can we also request this vaccine information ourselves from PCP?

## 2021-02-05 NOTE — Telephone Encounter (Signed)
Waiting on the pt to return call regarding his appt then he can be advised of his next step

## 2021-02-05 NOTE — Telephone Encounter (Signed)
Recommend he keep his appointment with Dr. Abbey Chatters on 8/3.   Regarding vaccines. He should be completing a series each of these vaccines. Does he know when he will be receiving his next dose?

## 2021-02-12 ENCOUNTER — Telehealth: Payer: Self-pay | Admitting: Internal Medicine

## 2021-02-12 NOTE — Telephone Encounter (Signed)
Pt called for nurse regarding his vaccination records being sent over. [phone]

## 2021-02-13 NOTE — Telephone Encounter (Signed)
Returned the pt's call and LMOVM.

## 2021-02-18 NOTE — Telephone Encounter (Signed)
noted 

## 2021-02-18 NOTE — Telephone Encounter (Signed)
Did you see this pt's labs/vaccinations from his PCP office?

## 2021-02-18 NOTE — Telephone Encounter (Signed)
Yes, I have reviewed his vaccine records.  He received hepatitis B vaccine series in 2002/2003 and also received varicella vaccine in 2003 in 2018.  However, his blood test we completed to evaluate for immunity to Hep B and Varicella were negative.  He does not have immunity to hepatitis B and does not have immunity to varicella.  He will need to get these vaccines updated. Please let patient know. Please also relay this information to PCP. Send copies and Hep B and Varicella labs completed 12/22/20 to PCP.

## 2021-02-19 NOTE — Telephone Encounter (Signed)
Communication noted.  

## 2021-02-19 NOTE — Telephone Encounter (Signed)
Phoned and spoke with the pt this morning and advised him of the above regarding his results and what he needed. Faxing the copies noted and a letter that the pt needs to update these vaccines to Lucretia Roers, [place] @ [facility]. The pt agreed to place the call today to his PCP

## 2021-02-24 NOTE — Telephone Encounter (Signed)
Pt LMOVM stating he had received the vaccines that were needed to begin his medication.

## 2021-02-24 NOTE — Telephone Encounter (Signed)
Spoke with patient.  States he received varicella and hepatitis B vaccination today.  He was told this was a one-time dose with no additional vaccines needed.  I spoke with nurse Levada Dy at [facility] who also confirmed that patient has completed his vaccines.  No plans for a series to be completed.  Advised patient that we have to wait at least 4 weeks after receiving his varicella vaccine prior to starting any biologic therapy.  He has an appointment with Dr. Abbey Chatters on 8/3 and will discuss choice of maintenance medication at that time.  He continues to do well on budesonide and will continue this for now.

## 2021-02-25 NOTE — Telephone Encounter (Signed)
Records shredded since patient did not call to schedule and is established with [facility].

## 2021-03-18 ENCOUNTER — Encounter: Payer: Self-pay | Admitting: Internal Medicine

## 2021-03-18 ENCOUNTER — Ambulatory Visit: Payer: Medicaid Other | Admitting: Internal Medicine

## 2021-04-05 ENCOUNTER — Emergency Department (HOSPITAL_COMMUNITY): Payer: BLUE CROSS/BLUE SHIELD

## 2021-04-05 ENCOUNTER — Inpatient Hospital Stay (HOSPITAL_COMMUNITY): Payer: BLUE CROSS/BLUE SHIELD | Admitting: Anesthesiology

## 2021-04-05 ENCOUNTER — Inpatient Hospital Stay (HOSPITAL_COMMUNITY): Payer: BLUE CROSS/BLUE SHIELD

## 2021-04-05 ENCOUNTER — Inpatient Hospital Stay (HOSPITAL_COMMUNITY)
Admission: EM | Admit: 2021-04-05 | Discharge: 2021-04-15 | DRG: 956 | Disposition: A | Discharge status: Rehabilitation Facility | Payer: BLUE CROSS/BLUE SHIELD | Attending: Surgery | Admitting: Surgery

## 2021-04-05 ENCOUNTER — Encounter (HOSPITAL_COMMUNITY): Admission: EM | Disposition: A | Discharge status: Rehabilitation Facility | Payer: Self-pay | Source: Home / Self Care

## 2021-04-05 ENCOUNTER — Other Ambulatory Visit: Payer: Self-pay

## 2021-04-05 ENCOUNTER — Encounter (HOSPITAL_COMMUNITY): Payer: Self-pay | Admitting: Surgery

## 2021-04-05 DIAGNOSIS — S42302B Unspecified fracture of shaft of humerus, left arm, initial encounter for open fracture: Secondary | ICD-10-CM

## 2021-04-05 DIAGNOSIS — S42309A Unspecified fracture of shaft of humerus, unspecified arm, initial encounter for closed fracture: Secondary | ICD-10-CM

## 2021-04-05 DIAGNOSIS — J939 Pneumothorax, unspecified: Secondary | ICD-10-CM

## 2021-04-05 DIAGNOSIS — S52122A Displaced fracture of head of left radius, initial encounter for closed fracture: Secondary | ICD-10-CM

## 2021-04-05 DIAGNOSIS — J9690 Respiratory failure, unspecified, unspecified whether with hypoxia or hypercapnia: Secondary | ICD-10-CM

## 2021-04-05 DIAGNOSIS — Z09 Encounter for follow-up examination after completed treatment for conditions other than malignant neoplasm: Secondary | ICD-10-CM

## 2021-04-05 DIAGNOSIS — T1490XA Injury, unspecified, initial encounter: Secondary | ICD-10-CM

## 2021-04-05 DIAGNOSIS — S72302B Unspecified fracture of shaft of left femur, initial encounter for open fracture type I or II: Secondary | ICD-10-CM | POA: Diagnosis present

## 2021-04-05 DIAGNOSIS — S91009A Unspecified open wound, unspecified ankle, initial encounter: Secondary | ICD-10-CM

## 2021-04-05 DIAGNOSIS — Z419 Encounter for procedure for purposes other than remedying health state, unspecified: Secondary | ICD-10-CM

## 2021-04-05 DIAGNOSIS — M25572 Pain in left ankle and joints of left foot: Secondary | ICD-10-CM

## 2021-04-05 DIAGNOSIS — S42462A Displaced fracture of medial condyle of left humerus, initial encounter for closed fracture: Secondary | ICD-10-CM

## 2021-04-05 DIAGNOSIS — S329XXA Fracture of unspecified parts of lumbosacral spine and pelvis, initial encounter for closed fracture: Secondary | ICD-10-CM

## 2021-04-05 DIAGNOSIS — S32592A Other specified fracture of left pubis, initial encounter for closed fracture: Secondary | ICD-10-CM | POA: Diagnosis present

## 2021-04-05 DIAGNOSIS — S36114A Minor laceration of liver, initial encounter: Secondary | ICD-10-CM

## 2021-04-05 DIAGNOSIS — Z20822 Contact with and (suspected) exposure to covid-19: Secondary | ICD-10-CM | POA: Diagnosis present

## 2021-04-05 DIAGNOSIS — S2221XA Fracture of manubrium, initial encounter for closed fracture: Secondary | ICD-10-CM | POA: Diagnosis present

## 2021-04-05 DIAGNOSIS — S27322A Contusion of lung, bilateral, initial encounter: Secondary | ICD-10-CM | POA: Diagnosis present

## 2021-04-05 DIAGNOSIS — Y92410 Unspecified street and highway as the place of occurrence of the external cause: Secondary | ICD-10-CM | POA: Diagnosis not present

## 2021-04-05 DIAGNOSIS — S332XXA Dislocation of sacroiliac and sacrococcygeal joint, initial encounter: Secondary | ICD-10-CM | POA: Diagnosis present

## 2021-04-05 DIAGNOSIS — Z23 Encounter for immunization: Secondary | ICD-10-CM | POA: Diagnosis not present

## 2021-04-05 DIAGNOSIS — Z880 Allergy status to penicillin: Secondary | ICD-10-CM

## 2021-04-05 DIAGNOSIS — K5900 Constipation, unspecified: Secondary | ICD-10-CM | POA: Diagnosis present

## 2021-04-05 DIAGNOSIS — U071 COVID-19: Secondary | ICD-10-CM | POA: Diagnosis present

## 2021-04-05 DIAGNOSIS — S52125A Nondisplaced fracture of head of left radius, initial encounter for closed fracture: Secondary | ICD-10-CM | POA: Diagnosis present

## 2021-04-05 DIAGNOSIS — K5190 Ulcerative colitis, unspecified, without complications: Secondary | ICD-10-CM | POA: Diagnosis present

## 2021-04-05 DIAGNOSIS — S32810A Multiple fractures of pelvis with stable disruption of pelvic ring, initial encounter for closed fracture: Secondary | ICD-10-CM | POA: Diagnosis present

## 2021-04-05 DIAGNOSIS — S42362A Displaced segmental fracture of shaft of humerus, left arm, initial encounter for closed fracture: Secondary | ICD-10-CM | POA: Diagnosis present

## 2021-04-05 DIAGNOSIS — D62 Acute posthemorrhagic anemia: Secondary | ICD-10-CM | POA: Diagnosis present

## 2021-04-05 DIAGNOSIS — S270XXA Traumatic pneumothorax, initial encounter: Secondary | ICD-10-CM | POA: Diagnosis present

## 2021-04-05 DIAGNOSIS — F17210 Nicotine dependence, cigarettes, uncomplicated: Secondary | ICD-10-CM | POA: Diagnosis present

## 2021-04-05 HISTORY — PX: I & D EXTREMITY: SHX5045

## 2021-04-05 HISTORY — DX: Ulcerative colitis, unspecified, without complications: K51.90

## 2021-04-05 HISTORY — PX: FEMUR IM NAIL: SHX1597

## 2021-04-05 LAB — RESP PANEL BY RT-PCR (FLU A&B, COVID) ARPGX2
Influenza A by PCR: NEGATIVE
Influenza B by PCR: NEGATIVE
SARS Coronavirus 2 by RT PCR: POSITIVE — AB

## 2021-04-05 LAB — CBC WITH DIFFERENTIAL/PLATELET
Abs Immature Granulocytes: 0.15 10*3/uL — ABNORMAL HIGH (ref 0.00–0.07)
Basophils Absolute: 0 10*3/uL (ref 0.0–0.1)
Basophils Relative: 0 %
Eosinophils Absolute: 0.1 10*3/uL (ref 0.0–0.5)
Eosinophils Relative: 1 %
HCT: 38.5 % — ABNORMAL LOW (ref 39.0–52.0)
Hemoglobin: 12.2 g/dL — ABNORMAL LOW (ref 13.0–17.0)
Immature Granulocytes: 1 %
Lymphocytes Relative: 27 %
Lymphs Abs: 3 10*3/uL (ref 0.7–4.0)
MCH: 28.3 pg (ref 26.0–34.0)
MCHC: 31.7 g/dL (ref 30.0–36.0)
MCV: 89.3 fL (ref 80.0–100.0)
Monocytes Absolute: 0.3 10*3/uL (ref 0.1–1.0)
Monocytes Relative: 2 %
Neutro Abs: 7.6 10*3/uL (ref 1.7–7.7)
Neutrophils Relative %: 69 %
Platelets: 294 10*3/uL (ref 150–400)
RBC: 4.31 MIL/uL (ref 4.22–5.81)
RDW: 13.6 % (ref 11.5–15.5)
WBC: 11.2 10*3/uL — ABNORMAL HIGH (ref 4.0–10.5)
nRBC: 0 % (ref 0.0–0.2)

## 2021-04-05 LAB — BASIC METABOLIC PANEL
Anion gap: 11 (ref 5–15)
BUN: 10 mg/dL (ref 6–20)
CO2: 23 mmol/L (ref 22–32)
Calcium: 8 mg/dL — ABNORMAL LOW (ref 8.9–10.3)
Chloride: 102 mmol/L (ref 98–111)
Creatinine, Ser: 1.03 mg/dL (ref 0.61–1.24)
GFR, Estimated: >60 mL/min (ref >60)
Glucose, Bld: 124 mg/dL — ABNORMAL HIGH (ref 70–99)
Potassium: 3.1 mmol/L — ABNORMAL LOW (ref 3.5–5.1)
Sodium: 136 mmol/L (ref 135–145)

## 2021-04-05 LAB — LACTIC ACID, PLASMA
Lactic Acid, Venous: 4.6 mmol/L (ref 0.5–1.9)
Lactic Acid, Venous: 2.8 mmol/L (ref 0.5–1.9)

## 2021-04-05 LAB — HIV ANTIBODY (ROUTINE TESTING W REFLEX): HIV Screen 4th Generation wRfx: NONREACTIVE

## 2021-04-05 LAB — PROTIME-INR
INR: 1.1 (ref 0.8–1.2)
Prothrombin Time: 14.6 seconds (ref 11.4–15.2)

## 2021-04-05 LAB — GLUCOSE, CAPILLARY: Glucose-Capillary: 137 mg/dL — ABNORMAL HIGH (ref 70–99)

## 2021-04-05 LAB — ETHANOL: Alcohol, Ethyl (B): 83 mg/dL — ABNORMAL HIGH (ref <10)

## 2021-04-05 SURGERY — INSERTION, INTRAMEDULLARY ROD, FEMUR, RETROGRADE
Anesthesia: General | Laterality: Left

## 2021-04-05 MED ORDER — METOCLOPRAMIDE HCL 5 MG/ML IJ SOLN: 5.0000 mg | Freq: Three times a day (TID) | INTRAMUSCULAR | Status: DC | PRN

## 2021-04-05 MED ORDER — CEFAZOLIN SODIUM-DEXTROSE 2-4 GM/100ML-% IV SOLN
2.0000 g | Freq: Once | INTRAVENOUS | Status: AC
  Administered 2021-04-05: 2 g via INTRAVENOUS
  Filled 2021-04-05: qty 100

## 2021-04-05 MED ORDER — PHENOL 1.4 % MT LIQD: 1.0000 | OROMUCOSAL | Status: DC | PRN

## 2021-04-05 MED ORDER — TETANUS-DIPHTH-ACELL PERTUSSIS 5-2.5-18.5 LF-MCG/0.5 IM SUSY
0.5000 mL | PREFILLED_SYRINGE | Freq: Once | INTRAMUSCULAR | Status: AC
  Administered 2021-04-05: 0.5 mL via INTRAMUSCULAR
  Filled 2021-04-05: qty 0.5

## 2021-04-05 MED ORDER — LACTATED RINGERS IV SOLN
INTRAVENOUS | Status: DC | PRN
Start: 2021-04-05 — End: 2021-04-05

## 2021-04-05 MED ORDER — FENTANYL CITRATE (PF) 100 MCG/2ML IJ SOLN
INTRAMUSCULAR | Status: DC | PRN
  Administered 2021-04-05: 100 ug via INTRAVENOUS
  Administered 2021-04-05: 50 ug via INTRAVENOUS
  Administered 2021-04-05: 100 ug via INTRAVENOUS
  Administered 2021-04-05: 150 ug via INTRAVENOUS

## 2021-04-05 MED ORDER — METHOCARBAMOL 500 MG PO TABS
500.0000 mg | ORAL_TABLET | Freq: Four times a day (QID) | ORAL | Status: DC | PRN
  Administered 2021-04-05 – 2021-04-08 (×3): 500 mg via ORAL
  Filled 2021-04-05 (×3): qty 1

## 2021-04-05 MED ORDER — SODIUM CHLORIDE 0.9 % IR SOLN
Status: DC | PRN
  Administered 2021-04-05: 3000 mL

## 2021-04-05 MED ORDER — MUPIROCIN 2 % EX OINT
1.0000 "application " | TOPICAL_OINTMENT | Freq: Two times a day (BID) | CUTANEOUS | Status: AC
  Administered 2021-04-05 – 2021-04-07 (×4): 1 via NASAL
  Filled 2021-04-05: qty 22

## 2021-04-05 MED ORDER — PROPOFOL 10 MG/ML IV BOLUS
INTRAVENOUS | Status: DC | PRN
  Administered 2021-04-05: 200 mg via INTRAVENOUS

## 2021-04-05 MED ORDER — OXYCODONE HCL 5 MG PO TABS: 5.0000 mg | ORAL_TABLET | Freq: Once | ORAL | Status: DC | PRN

## 2021-04-05 MED ORDER — OXYCODONE HCL 5 MG/5ML PO SOLN: 5.0000 mg | Freq: Once | ORAL | Status: DC | PRN

## 2021-04-05 MED ORDER — OXYCODONE HCL 5 MG PO TABS
10.0000 mg | ORAL_TABLET | ORAL | Status: DC | PRN
  Administered 2021-04-05 – 2021-04-08 (×2): 10 mg via ORAL
  Administered 2021-04-08: 15 mg via ORAL
  Administered 2021-04-10 – 2021-04-11 (×2): 10 mg via ORAL
  Administered 2021-04-11 – 2021-04-12 (×3): 15 mg via ORAL
  Administered 2021-04-13: 10 mg via ORAL
  Administered 2021-04-13: 15 mg via ORAL
  Administered 2021-04-15: 10 mg via ORAL
  Filled 2021-04-05 (×2): qty 2
  Filled 2021-04-05 (×2): qty 3
  Filled 2021-04-05: qty 2
  Filled 2021-04-05 (×2): qty 3
  Filled 2021-04-05: qty 2
  Filled 2021-04-05: qty 3
  Filled 2021-04-05: qty 2

## 2021-04-05 MED ORDER — LIDOCAINE HCL (CARDIAC) PF 100 MG/5ML IV SOSY
PREFILLED_SYRINGE | INTRAVENOUS | Status: DC | PRN
  Administered 2021-04-05: 60 mg via INTRATRACHEAL

## 2021-04-05 MED ORDER — FENTANYL CITRATE (PF) 100 MCG/2ML IJ SOLN: 25.0000 ug | INTRAMUSCULAR | Status: DC | PRN

## 2021-04-05 MED ORDER — BUDESONIDE 3 MG PO CPEP
9.0000 mg | ORAL_CAPSULE | Freq: Every day | ORAL | Status: DC
  Administered 2021-04-05 – 2021-04-15 (×11): 9 mg via ORAL
  Filled 2021-04-05 (×11): qty 3

## 2021-04-05 MED ORDER — METHOCARBAMOL 1000 MG/10ML IJ SOLN
500.0000 mg | Freq: Four times a day (QID) | INTRAVENOUS | Status: DC | PRN
  Filled 2021-04-05: qty 5

## 2021-04-05 MED ORDER — METOCLOPRAMIDE HCL 5 MG PO TABS: 5.0000 mg | ORAL_TABLET | Freq: Three times a day (TID) | ORAL | Status: DC | PRN

## 2021-04-05 MED ORDER — MIDAZOLAM HCL 2 MG/2ML IJ SOLN
INTRAMUSCULAR | Status: AC
  Filled 2021-04-05: qty 2

## 2021-04-05 MED ORDER — ROCURONIUM 10MG/ML (10ML) SYRINGE FOR MEDFUSION PUMP - OPTIME
INTRAVENOUS | Status: DC | PRN
  Administered 2021-04-05: 50 mg via INTRAVENOUS

## 2021-04-05 MED ORDER — TOBRAMYCIN SULFATE 1.2 G IJ SOLR
INTRAMUSCULAR | Status: DC | PRN
  Administered 2021-04-05: 1.2 g

## 2021-04-05 MED ORDER — ONDANSETRON HCL 4 MG/2ML IJ SOLN
INTRAMUSCULAR | Status: DC | PRN
  Administered 2021-04-05: 4 mg via INTRAVENOUS

## 2021-04-05 MED ORDER — ENOXAPARIN SODIUM 30 MG/0.3ML IJ SOSY
30.0000 mg | PREFILLED_SYRINGE | Freq: Two times a day (BID) | INTRAMUSCULAR | Status: DC
  Administered 2021-04-07 – 2021-04-15 (×16): 30 mg via SUBCUTANEOUS
  Filled 2021-04-05 (×17): qty 0.3

## 2021-04-05 MED ORDER — SUCCINYLCHOLINE CHLORIDE 200 MG/10ML IV SOSY
PREFILLED_SYRINGE | INTRAVENOUS | Status: DC | PRN
  Administered 2021-04-05: 100 mg via INTRAVENOUS

## 2021-04-05 MED ORDER — ONDANSETRON HCL 4 MG/2ML IJ SOLN: 4.0000 mg | Freq: Four times a day (QID) | INTRAMUSCULAR | Status: DC | PRN

## 2021-04-05 MED ORDER — MIDAZOLAM HCL 2 MG/2ML IJ SOLN
INTRAMUSCULAR | Status: DC | PRN
  Administered 2021-04-05: 2 mg via INTRAVENOUS

## 2021-04-05 MED ORDER — 0.9 % SODIUM CHLORIDE (POUR BTL) OPTIME
TOPICAL | Status: DC | PRN
  Administered 2021-04-05: 1000 mL

## 2021-04-05 MED ORDER — IOHEXOL 350 MG/ML SOLN
75.0000 mL | Freq: Once | INTRAVENOUS | Status: AC | PRN
  Administered 2021-04-05: 75 mL via INTRAVENOUS

## 2021-04-05 MED ORDER — GABAPENTIN 300 MG PO CAPS
300.0000 mg | ORAL_CAPSULE | Freq: Three times a day (TID) | ORAL | Status: DC
  Administered 2021-04-05 – 2021-04-15 (×29): 300 mg via ORAL
  Filled 2021-04-05 (×29): qty 1

## 2021-04-05 MED ORDER — HYDROMORPHONE HCL 1 MG/ML IJ SOLN
0.5000 mg | INTRAMUSCULAR | Status: DC | PRN
Start: 2021-04-05 — End: 2021-04-05
  Administered 2021-04-05 (×2): 0.5 mg via INTRAVENOUS
  Filled 2021-04-05: qty 1
  Filled 2021-04-05: qty 0.5

## 2021-04-05 MED ORDER — PROMETHAZINE HCL 25 MG/ML IJ SOLN: 6.2500 mg | INTRAMUSCULAR | Status: DC | PRN

## 2021-04-05 MED ORDER — ONDANSETRON HCL 4 MG/2ML IJ SOLN
4.0000 mg | Freq: Four times a day (QID) | INTRAMUSCULAR | Status: DC | PRN
  Filled 2021-04-05: qty 2

## 2021-04-05 MED ORDER — DEXAMETHASONE SODIUM PHOSPHATE 10 MG/ML IJ SOLN
INTRAMUSCULAR | Status: DC | PRN
  Administered 2021-04-05: 10 mg via INTRAVENOUS

## 2021-04-05 MED ORDER — PROPOFOL 10 MG/ML IV BOLUS
INTRAVENOUS | Status: AC
  Filled 2021-04-05: qty 20

## 2021-04-05 MED ORDER — LACTATED RINGERS IV SOLN: INTRAVENOUS | Status: DC

## 2021-04-05 MED ORDER — DOCUSATE SODIUM 100 MG PO CAPS
100.0000 mg | ORAL_CAPSULE | Freq: Two times a day (BID) | ORAL | Status: DC
  Administered 2021-04-05 – 2021-04-15 (×17): 100 mg via ORAL
  Filled 2021-04-05 (×20): qty 1

## 2021-04-05 MED ORDER — HYDROMORPHONE HCL 1 MG/ML IJ SOLN
0.5000 mg | INTRAMUSCULAR | Status: DC | PRN
  Administered 2021-04-07 – 2021-04-13 (×3): 1 mg via INTRAVENOUS
  Filled 2021-04-05 (×4): qty 1

## 2021-04-05 MED ORDER — ACETAMINOPHEN 325 MG PO TABS
650.0000 mg | ORAL_TABLET | Freq: Four times a day (QID) | ORAL | Status: DC
  Administered 2021-04-05 – 2021-04-07 (×6): 650 mg via ORAL
  Filled 2021-04-05 (×6): qty 2

## 2021-04-05 MED ORDER — ONDANSETRON 4 MG PO TBDP: 4.0000 mg | ORAL_TABLET | Freq: Four times a day (QID) | ORAL | Status: DC | PRN

## 2021-04-05 MED ORDER — VANCOMYCIN HCL 1000 MG IV SOLR
INTRAVENOUS | Status: DC | PRN
  Administered 2021-04-05: 1000 mg

## 2021-04-05 MED ORDER — SUGAMMADEX SODIUM 200 MG/2ML IV SOLN
INTRAVENOUS | Status: DC | PRN
  Administered 2021-04-05: 200 mg via INTRAVENOUS

## 2021-04-05 MED ORDER — DOCUSATE SODIUM 100 MG PO CAPS
100.0000 mg | ORAL_CAPSULE | Freq: Two times a day (BID) | ORAL | Status: DC
  Administered 2021-04-05: 100 mg via ORAL
  Filled 2021-04-05: qty 1

## 2021-04-05 MED ORDER — MENTHOL 3 MG MT LOZG: 1.0000 | LOZENGE | OROMUCOSAL | Status: DC | PRN

## 2021-04-05 MED ORDER — CEFAZOLIN SODIUM-DEXTROSE 2-4 GM/100ML-% IV SOLN
INTRAVENOUS | Status: AC
  Filled 2021-04-05: qty 100

## 2021-04-05 MED ORDER — VANCOMYCIN HCL 1000 MG IV SOLR
INTRAVENOUS | Status: AC
  Filled 2021-04-05: qty 20

## 2021-04-05 MED ORDER — FENTANYL CITRATE (PF) 100 MCG/2ML IJ SOLN
INTRAMUSCULAR | Status: AC
  Administered 2021-04-05: 50 ug
  Filled 2021-04-05: qty 2

## 2021-04-05 MED ORDER — FENTANYL CITRATE (PF) 250 MCG/5ML IJ SOLN
INTRAMUSCULAR | Status: AC
  Filled 2021-04-05: qty 5

## 2021-04-05 MED ORDER — PANTOPRAZOLE SODIUM 40 MG PO TBEC
40.0000 mg | DELAYED_RELEASE_TABLET | Freq: Every day | ORAL | Status: DC
  Administered 2021-04-05 – 2021-04-15 (×11): 40 mg via ORAL
  Filled 2021-04-05 (×11): qty 1

## 2021-04-05 MED ORDER — OXYCODONE HCL 5 MG PO TABS
5.0000 mg | ORAL_TABLET | ORAL | Status: DC | PRN
  Administered 2021-04-06 – 2021-04-09 (×5): 10 mg via ORAL
  Administered 2021-04-09: 5 mg via ORAL
  Administered 2021-04-10 – 2021-04-14 (×6): 10 mg via ORAL
  Filled 2021-04-05 (×15): qty 2

## 2021-04-05 MED ORDER — SODIUM CHLORIDE 0.9 % IV BOLUS
1000.0000 mL | Freq: Once | INTRAVENOUS | Status: AC
  Administered 2021-04-05: 1000 mL via INTRAVENOUS

## 2021-04-05 MED ORDER — PHENYLEPHRINE HCL (PRESSORS) 10 MG/ML IV SOLN
INTRAVENOUS | Status: DC | PRN
  Administered 2021-04-05: 100 ug via INTRAVENOUS

## 2021-04-05 MED ORDER — TOBRAMYCIN SULFATE 1.2 G IJ SOLR
INTRAMUSCULAR | Status: AC
  Filled 2021-04-05: qty 1.2

## 2021-04-05 MED ORDER — ONDANSETRON HCL 4 MG PO TABS: 4.0000 mg | ORAL_TABLET | Freq: Four times a day (QID) | ORAL | Status: DC | PRN

## 2021-04-05 MED ORDER — OXYCODONE HCL 5 MG PO TABS
5.0000 mg | ORAL_TABLET | ORAL | Status: DC | PRN
  Administered 2021-04-05: 10 mg via ORAL
  Filled 2021-04-05: qty 2

## 2021-04-05 MED ORDER — CEFAZOLIN SODIUM-DEXTROSE 2-3 GM-%(50ML) IV SOLR
INTRAVENOUS | Status: DC | PRN
  Administered 2021-04-05: 2 g via INTRAVENOUS

## 2021-04-05 MED ORDER — CEFAZOLIN SODIUM-DEXTROSE 2-4 GM/100ML-% IV SOLN
2.0000 g | Freq: Three times a day (TID) | INTRAVENOUS | Status: AC
  Administered 2021-04-05 – 2021-04-06 (×3): 2 g via INTRAVENOUS
  Filled 2021-04-05 (×4): qty 100

## 2021-04-05 SURGICAL SUPPLY — 57 items
BAG COUNTER SPONGE SURGICOUNT (BAG) ×2 IMPLANT
BIT DRILL 4.0X165 AO STYLE (BIT) ×4 IMPLANT
BIT DRILL CALIBRATED AO 5.5 (DRILL) ×1 IMPLANT
BNDG COHESIVE 4X5 TAN STRL (GAUZE/BANDAGES/DRESSINGS) ×2 IMPLANT
BNDG ELASTIC 4X5.8 VLCR STR LF (GAUZE/BANDAGES/DRESSINGS) ×2 IMPLANT
BNDG ELASTIC 6X10 VLCR STRL LF (GAUZE/BANDAGES/DRESSINGS) ×2 IMPLANT
BNDG ELASTIC 6X5.8 VLCR STR LF (GAUZE/BANDAGES/DRESSINGS) ×2 IMPLANT
BNDG GAUZE ELAST 4 BULKY (GAUZE/BANDAGES/DRESSINGS) ×2 IMPLANT
CLSR STERI-STRIP ANTIMIC 1/2X4 (GAUZE/BANDAGES/DRESSINGS) ×2 IMPLANT
COVER PERINEAL POST (MISCELLANEOUS) ×2 IMPLANT
COVER SURGICAL LIGHT HANDLE (MISCELLANEOUS) ×2 IMPLANT
DRAPE STERI IOBAN 125X83 (DRAPES) ×2 IMPLANT
DRAPE U-SHAPE 47X51 STRL (DRAPES) ×4 IMPLANT
DRILL CALIBRATED AO 5.5 (DRILL) ×2
DRSG AQUACEL AG ADV 3.5X 6 (GAUZE/BANDAGES/DRESSINGS) ×4 IMPLANT
DRSG PAD ABDOMINAL 8X10 ST (GAUZE/BANDAGES/DRESSINGS) ×2 IMPLANT
ELECT REM PT RETURN 9FT ADLT (ELECTROSURGICAL) ×2
ELECTRODE REM PT RTRN 9FT ADLT (ELECTROSURGICAL) ×1 IMPLANT
GAUZE SPONGE 4X4 12PLY STRL (GAUZE/BANDAGES/DRESSINGS) ×2 IMPLANT
GAUZE XEROFORM 1X8 LF (GAUZE/BANDAGES/DRESSINGS) ×2 IMPLANT
GLOVE SRG 8 PF TXTR STRL LF DI (GLOVE) ×1 IMPLANT
GLOVE SURG ENC MOIS LTX SZ6.5 (GLOVE) ×2 IMPLANT
GLOVE SURG LTX SZ8 (GLOVE) ×4 IMPLANT
GLOVE SURG UNDER LTX SZ6.5 (GLOVE) ×2 IMPLANT
GLOVE SURG UNDER POLY LF SZ8 (GLOVE) ×2
GOWN STRL REUS W/ TWL LRG LVL3 (GOWN DISPOSABLE) ×1 IMPLANT
GOWN STRL REUS W/ TWL XL LVL3 (GOWN DISPOSABLE) ×1 IMPLANT
GOWN STRL REUS W/TWL LRG LVL3 (GOWN DISPOSABLE) ×2
GOWN STRL REUS W/TWL XL LVL3 (GOWN DISPOSABLE) ×2
GUIDEWIRE BALL NOSE 3.0X900 (WIRE) ×2
GUIDEWIRE ORTH 900X3XBALL NOSE (WIRE) ×1 IMPLANT
KIT BASIN OR (CUSTOM PROCEDURE TRAY) ×2 IMPLANT
KIT TURNOVER KIT B (KITS) ×2 IMPLANT
MANIFOLD NEPTUNE II (INSTRUMENTS) ×2 IMPLANT
NAIL FEM RETRO 10X38 (Nail) ×2 IMPLANT
NS IRRIG 1000ML POUR BTL (IV SOLUTION) ×2 IMPLANT
PACK GENERAL/GYN (CUSTOM PROCEDURE TRAY) ×2 IMPLANT
PACK ORTHO EXTREMITY (CUSTOM PROCEDURE TRAY) ×2 IMPLANT
PAD ARMBOARD 7.5X6 YLW CONV (MISCELLANEOUS) ×4 IMPLANT
PAD CAST 4YDX4 CTTN HI CHSV (CAST SUPPLIES) ×1 IMPLANT
PADDING CAST COTTON 4X4 STRL (CAST SUPPLIES) ×2
PADDING CAST COTTON 6X4 STRL (CAST SUPPLIES) ×2 IMPLANT
PIN GUIDE THRD AR 3.2X330 (PIN) ×4 IMPLANT
SCREW CANC FT 6.5X60 (Screw) ×2 IMPLANT
SCREW CANC FT 6.5X85 (Screw) ×2 IMPLANT
SCREW LOCK CORT 5.0X40 (Screw) ×2 IMPLANT
SCREW LOCK FEM 5X46 (Screw) ×2 IMPLANT
SPONGE T-LAP 18X18 ~~LOC~~+RFID (SPONGE) ×4 IMPLANT
STRIP CLOSURE SKIN 1/2X4 (GAUZE/BANDAGES/DRESSINGS) ×2 IMPLANT
SUT ETHILON 2 0 FS 18 (SUTURE) ×2 IMPLANT
SUT MNCRL AB 4-0 PS2 18 (SUTURE) ×2 IMPLANT
SUT VIC AB 0 CT1 27 (SUTURE) ×2
SUT VIC AB 0 CT1 27XBRD ANBCTR (SUTURE) ×1 IMPLANT
SUT VIC AB 2-0 CT1 27 (SUTURE) ×2
SUT VIC AB 2-0 CT1 TAPERPNT 27 (SUTURE) ×1 IMPLANT
TOWEL GREEN STERILE FF (TOWEL DISPOSABLE) ×2 IMPLANT
YANKAUER SUCT BULB TIP NO VENT (SUCTIONS) ×2 IMPLANT

## 2021-04-05 NOTE — Progress Notes (Addendum)
Pt admitted to 6N12 from ED. A/O x 4 but drowsy. Able to rouse easily. VSS. No distress noted. Splint to LLE clean, dry, and intact. Soft cast (?) to LUE clean/dry/intact. Small laceration on left ear with dried blood noted. He has multiple abrasions on left cheek.  Patient oriented to room. Call bell within reach. Bed in lowest position. Will continue to monitor.   0700. Left message for parent, Tamela Oddi, [phone], to call [place] and ask to speak with the nurse.

## 2021-04-05 NOTE — Progress Notes (Addendum)
Consult request received. Full consultation to follow. Patient has been posted for debridement and retrograde IMN of the left femur, which Dr. Griffin Basil has kindly agreed to manage. Delayed ORIF of the right hemipelvis and left humerus shaft and supracondylar fractures to be done early this week based on patient stability. CT head and neck negative. CT abd with liver laceration grade 1. Repeat CXR this am showed no progression of small apical right PTX. Can recheck post op. Lactic acid already down to 2.8 from 4.6 as of 0620 and cleared by Trauma Service to proceed.  Altamese Winfield, MD [facility], [facility] [phone]

## 2021-04-05 NOTE — ED Notes (Signed)
At bedside with ortho tech. Assisted with arm splint and wound dressing at this time. Arms and legs cleaned with a warm wash cloth at this time. Wounds on lt arm dressed with xeroform gauze, non adhesive gauze and kerlex prior to splint being applied
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
## 2021-04-05 NOTE — ED Notes (Signed)
Per trauma provider pt leg needed to be splinted prior to pt going to the floor

## 2021-04-05 NOTE — ED Notes (Signed)
Providers at bedside.

## 2021-04-05 NOTE — ED Notes (Signed)
Provider at bedside verifying x-rays at this time

## 2021-04-05 NOTE — ED Notes (Signed)
Ortho tech paged  

## 2021-04-05 NOTE — Anesthesia Postprocedure Evaluation (Signed)
Anesthesia Post Note  Patient: Jerry Cordova  Procedure(s) Performed: INTRAMEDULLARY (IM) RETROGRADE FEMORAL NAILING (Left) IRRIGATION AND DEBRIDEMENT LEFT LEG (Left)     Patient location during evaluation: PACU Anesthesia Type: General Level of consciousness: awake and alert Pain management: pain level controlled Vital Signs Assessment: post-procedure vital signs reviewed and stable Respiratory status: spontaneous breathing, nonlabored ventilation, respiratory function stable and patient connected to nasal cannula oxygen Cardiovascular status: blood pressure returned to baseline and stable Postop Assessment: no apparent nausea or vomiting Anesthetic complications: no   No notable events documented.  Last Vitals:  Vitals:   04/05/21 1345 04/05/21 1400  BP: (!) 162/83 (!) 159/85  Pulse: 81 81  Resp: 18 16  Temp:    SpO2: 100% 100%    Last Pain:  Vitals:   04/05/21 1400  TempSrc:   PainSc: 0-No pain                 Belenda Cruise P Shardai Star
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
## 2021-04-05 NOTE — TOC CAGE-AID Note (Signed)
Transition of Care [facility]) - CAGE-AID Screening   Patient Details  Name: Jerry Cordova MRN: [id number] Date of Birth: [birth year]     Elvina Sidle, RN Trauma Response Nurse Phone Number: 04/05/2021, 7:45 PM    CAGE-AID Screening:    Have You Ever Felt You Ought to Cut Down on Your Drinking or Drug Use?: No Have People Annoyed You By [employer] Your Drinking Or Drug Use?: No Have You Felt Bad Or Guilty About Your Drinking Or Drug Use?: No Have You Ever Had a Drink or Used Drugs First Thing In The Morning to Steady Your Nerves or to Get Rid of a Hangover?: No CAGE-AID Score: 0  Substance Abuse Education Offered: No (drinks alcohol socially)

## 2021-04-05 NOTE — OR Nursing (Signed)
Phone consent obtained. 2 RN verification completed by Orvis Brill, and myself. Mother Tamela Oddi verbally agreed to surgery consent  over the phone due to pt already receiving pain medication.

## 2021-04-05 NOTE — ED Notes (Signed)
Attempted to call report - no answer at this time-  

## 2021-04-05 NOTE — ED Notes (Signed)
Radiology at bedside taking images

## 2021-04-05 NOTE — Progress Notes (Signed)
   04/05/21 0612  Provider Notification  Provider Name/Title Zenia Resides [phone]  Date Provider Notified 04/05/21  Time Provider Notified [phone]  Notification Type Page  Notification Reason Critical result (lactic acid 4.6)  Test performed and critical result lactic acid 4.6  Date Critical Result Received 04/05/21  Time Critical Result Received 0608  Provider response Other (Comment) (waiting for response)
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
## 2021-04-05 NOTE — Progress Notes (Signed)
Orthopedic Tech Progress Note Patient Details:  GUSTAF MCCARTER [birth year] [id number]  Ortho Devices Type of Ortho Device: Coapt, Post (long leg) splint Ortho Device/Splint Location: lue. lle. i applied splints to support the limbs until surgery at the drs request. Ortho Device/Splint Interventions: Ordered, Application, Adjustment   Post Interventions Patient Tolerated: Well Instructions Provided: Care of device, Adjustment of device  Karolee Stamps 04/05/2021, 6:22 AM

## 2021-04-05 NOTE — ED Notes (Signed)
Provider at bedside advised pt of pelvis fx and small lung puncture on lt side

## 2021-04-05 NOTE — ED Notes (Signed)
Floor made aware of the delay due to splinting of leg. And covid is +

## 2021-04-05 NOTE — ED Notes (Signed)
Returned from ct 
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
## 2021-04-05 NOTE — ED Notes (Signed)
Contacted lab and changed covid test to the 2 hr covid test

## 2021-04-05 NOTE — ED Notes (Signed)
Patient transported to CT 

## 2021-04-05 NOTE — Transfer of Care (Signed)
Immediate Anesthesia Transfer of Care Note  Patient: Jerry Cordova  Procedure(s) Performed: INTRAMEDULLARY (IM) RETROGRADE FEMORAL NAILING (Left) IRRIGATION AND DEBRIDEMENT LEFT LEG (Left)  Patient Location: PACU  Anesthesia Type:General  Level of Consciousness: awake and alert   Airway & Oxygen Therapy: Patient Spontanous Breathing and Patient connected to nasal cannula oxygen  Post-op Assessment: Report given to RN and Post -op Vital signs reviewed and stable  Post vital signs: Reviewed and stable  Last Vitals:  Vitals Value Taken Time  BP 152/67 04/05/21 1330  Temp 36.6 C 04/05/21 1323  Pulse 88 04/05/21 1330  Resp 17 04/05/21 1330  SpO2 100 % 04/05/21 1330    Last Pain:  Vitals:   04/05/21 1147  TempSrc:   PainSc: 7          Complications: No notable events documented.

## 2021-04-05 NOTE — ED Notes (Signed)
Radiology at bedside
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
## 2021-04-05 NOTE — ED Notes (Signed)
Received verbal orders for pain medication

## 2021-04-05 NOTE — Progress Notes (Addendum)
Patient is off floor for surgery.  1500 Patient is back to the unit. VSS, AxOx4.

## 2021-04-05 NOTE — ED Notes (Signed)
Ortho tech at bedside at this time

## 2021-04-05 NOTE — ED Notes (Signed)
Radiology at bedside at this time.

## 2021-04-05 NOTE — ED Notes (Signed)
Pt log rolled with c-spine control maintained. Pt denied any pain with palpation by the provider

## 2021-04-05 NOTE — ED Notes (Signed)
Received call from Lab that pt has a Covid + test

## 2021-04-05 NOTE — Progress Notes (Signed)
This RN got a call from [facility] for report before transporting the patient to OR. This RN notified her that there was no order for procedure and I did not get the consent yet. OR nurse notified this RN that the surgeon has been up to speak to the patient about the procedure. This RN informed the OR nurse that the patient and family had some questions about the procedure and plan of care. Pain medication was administered to the patient and consent was not signed on the floor. Charge nurse made aware.

## 2021-04-05 NOTE — Anesthesia Procedure Notes (Signed)
Procedure Name: Intubation Date/Time: 04/05/2021 11:08 AM Performed by: Eligha Bridegroom, CRNA Pre-anesthesia Checklist: Patient identified, Emergency Drugs available, Suction available, Patient being monitored and Timeout performed Patient Re-evaluated:Patient Re-evaluated prior to induction Oxygen Delivery Method: Circle system utilized Induction Type: IV induction and Rapid sequence Laryngoscope Size: Mac and 4 Grade View: Grade I Tube type: Oral Tube size: 7.5 mm Placement Confirmation: ETT inserted through vocal cords under direct vision, positive ETCO2 and breath sounds checked- equal and bilateral Secured at: 21 cm Tube secured with: Tape Dental Injury: Teeth and Oropharynx as per pre-operative assessment
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
## 2021-04-05 NOTE — ED Notes (Signed)
Attempted to call report-no answer 

## 2021-04-06 ENCOUNTER — Inpatient Hospital Stay (HOSPITAL_COMMUNITY): Payer: BLUE CROSS/BLUE SHIELD | Admitting: Certified Registered"

## 2021-04-06 ENCOUNTER — Inpatient Hospital Stay (HOSPITAL_COMMUNITY): Payer: BLUE CROSS/BLUE SHIELD

## 2021-04-06 ENCOUNTER — Encounter (HOSPITAL_COMMUNITY): Payer: Self-pay | Admitting: Orthopaedic Surgery

## 2021-04-06 ENCOUNTER — Encounter (HOSPITAL_COMMUNITY): Admission: EM | Disposition: A | Discharge status: Rehabilitation Facility | Payer: Self-pay | Source: Home / Self Care

## 2021-04-06 DIAGNOSIS — S329XXA Fracture of unspecified parts of lumbosacral spine and pelvis, initial encounter for closed fracture: Secondary | ICD-10-CM

## 2021-04-06 DIAGNOSIS — S52122A Displaced fracture of head of left radius, initial encounter for closed fracture: Secondary | ICD-10-CM

## 2021-04-06 DIAGNOSIS — S42302B Unspecified fracture of shaft of humerus, left arm, initial encounter for open fracture: Secondary | ICD-10-CM

## 2021-04-06 DIAGNOSIS — S42462A Displaced fracture of medial condyle of left humerus, initial encounter for closed fracture: Secondary | ICD-10-CM

## 2021-04-06 HISTORY — PX: ORIF HUMERUS FRACTURE: SHX2126

## 2021-04-06 HISTORY — PX: ORIF PELVIC FRACTURE: SHX2128

## 2021-04-06 LAB — POCT I-STAT 7, (LYTES, BLD GAS, ICA,H+H)
Acid-Base Excess: 4 mmol/L — ABNORMAL HIGH (ref 0.0–2.0)
Bicarbonate: 30.5 mmol/L — ABNORMAL HIGH (ref 20.0–28.0)
Calcium, Ion: 1.18 mmol/L (ref 1.15–1.40)
HCT: 23 % — ABNORMAL LOW (ref 39.0–52.0)
Hemoglobin: 7.8 g/dL — ABNORMAL LOW (ref 13.0–17.0)
O2 Saturation: 41 %
Patient temperature: 36.6
Potassium: 4.8 mmol/L (ref 3.5–5.1)
Sodium: 136 mmol/L (ref 135–145)
TCO2: 32 mmol/L (ref 22–32)
pCO2 arterial: 56.2 mmHg — ABNORMAL HIGH (ref 32.0–48.0)
pH, Arterial: 7.341 — ABNORMAL LOW (ref 7.350–7.450)
pO2, Arterial: 25 mmHg — CL (ref 83.0–108.0)

## 2021-04-06 LAB — CBC
HCT: 26 % — ABNORMAL LOW (ref 39.0–52.0)
Hemoglobin: 8.7 g/dL — ABNORMAL LOW (ref 13.0–17.0)
MCH: 28 pg (ref 26.0–34.0)
MCHC: 33.5 g/dL (ref 30.0–36.0)
MCV: 83.6 fL (ref 80.0–100.0)
Platelets: 230 10*3/uL (ref 150–400)
RBC: 3.11 MIL/uL — ABNORMAL LOW (ref 4.22–5.81)
RDW: 13.6 % (ref 11.5–15.5)
WBC: 13.1 10*3/uL — ABNORMAL HIGH (ref 4.0–10.5)
nRBC: 0 % (ref 0.0–0.2)

## 2021-04-06 LAB — POCT I-STAT, CHEM 8
BUN: 11 mg/dL (ref 6–20)
Calcium, Ion: 1.18 mmol/L (ref 1.15–1.40)
Chloride: 97 mmol/L — ABNORMAL LOW (ref 98–111)
Creatinine, Ser: 0.8 mg/dL (ref 0.61–1.24)
Glucose, Bld: 130 mg/dL — ABNORMAL HIGH (ref 70–99)
HCT: 26 % — ABNORMAL LOW (ref 39.0–52.0)
Hemoglobin: 8.8 g/dL — ABNORMAL LOW (ref 13.0–17.0)
Potassium: 4.5 mmol/L (ref 3.5–5.1)
Sodium: 136 mmol/L (ref 135–145)
TCO2: 32 mmol/L (ref 22–32)

## 2021-04-06 LAB — BASIC METABOLIC PANEL
Anion gap: 6 (ref 5–15)
BUN: 10 mg/dL (ref 6–20)
CO2: 29 mmol/L (ref 22–32)
Calcium: 8.1 mg/dL — ABNORMAL LOW (ref 8.9–10.3)
Chloride: 99 mmol/L (ref 98–111)
Creatinine, Ser: 0.82 mg/dL (ref 0.61–1.24)
GFR, Estimated: >60 mL/min (ref >60)
Glucose, Bld: 114 mg/dL — ABNORMAL HIGH (ref 70–99)
Potassium: 4 mmol/L (ref 3.5–5.1)
Sodium: 134 mmol/L — ABNORMAL LOW (ref 135–145)

## 2021-04-06 SURGERY — OPEN REDUCTION INTERNAL FIXATION (ORIF) PELVIC FRACTURE
Anesthesia: General | Site: Pelvis

## 2021-04-06 MED ORDER — MIDAZOLAM HCL 2 MG/2ML IJ SOLN
INTRAMUSCULAR | Status: DC | PRN
  Administered 2021-04-06: 2 mg via INTRAVENOUS

## 2021-04-06 MED ORDER — SUGAMMADEX SODIUM 200 MG/2ML IV SOLN
INTRAVENOUS | Status: DC | PRN
  Administered 2021-04-06: 200 mg via INTRAVENOUS

## 2021-04-06 MED ORDER — HYDROMORPHONE HCL 1 MG/ML IJ SOLN
INTRAMUSCULAR | Status: AC
  Filled 2021-04-06: qty 1

## 2021-04-06 MED ORDER — POLYETHYLENE GLYCOL 3350 17 G PO PACK
17.0000 g | PACK | Freq: Every day | ORAL | Status: DC
  Administered 2021-04-06 – 2021-04-07 (×2): 17 g via ORAL
  Filled 2021-04-06 (×2): qty 1

## 2021-04-06 MED ORDER — ROCURONIUM BROMIDE 100 MG/10ML IV SOLN
INTRAVENOUS | Status: DC | PRN
  Administered 2021-04-06: 10 mg via INTRAVENOUS
  Administered 2021-04-06: 20 mg via INTRAVENOUS
  Administered 2021-04-06: 10 mg via INTRAVENOUS
  Administered 2021-04-06 (×2): 20 mg via INTRAVENOUS
  Administered 2021-04-06: 50 mg via INTRAVENOUS

## 2021-04-06 MED ORDER — PROPOFOL 10 MG/ML IV BOLUS
INTRAVENOUS | Status: AC
  Filled 2021-04-06: qty 20

## 2021-04-06 MED ORDER — 0.9 % SODIUM CHLORIDE (POUR BTL) OPTIME
TOPICAL | Status: DC | PRN
  Administered 2021-04-06: 1000 mL

## 2021-04-06 MED ORDER — FENTANYL CITRATE (PF) 100 MCG/2ML IJ SOLN
INTRAMUSCULAR | Status: AC
  Filled 2021-04-06: qty 2

## 2021-04-06 MED ORDER — FENTANYL CITRATE (PF) 250 MCG/5ML IJ SOLN
INTRAMUSCULAR | Status: DC | PRN
  Administered 2021-04-06 (×3): 50 ug via INTRAVENOUS
  Administered 2021-04-06: 150 ug via INTRAVENOUS
  Administered 2021-04-06: 50 ug via INTRAVENOUS
  Administered 2021-04-06: 100 ug via INTRAVENOUS

## 2021-04-06 MED ORDER — PROPOFOL 10 MG/ML IV BOLUS
INTRAVENOUS | Status: DC | PRN
  Administered 2021-04-06: 200 mg via INTRAVENOUS

## 2021-04-06 MED ORDER — VANCOMYCIN HCL 1000 MG IV SOLR
INTRAVENOUS | Status: AC
  Filled 2021-04-06: qty 20

## 2021-04-06 MED ORDER — ALBUMIN HUMAN 5 % IV SOLN
INTRAVENOUS | Status: DC | PRN
Start: 2021-04-06 — End: 2021-04-06

## 2021-04-06 MED ORDER — FENTANYL CITRATE (PF) 250 MCG/5ML IJ SOLN
INTRAMUSCULAR | Status: AC
  Filled 2021-04-06: qty 5

## 2021-04-06 MED ORDER — HYDROMORPHONE HCL 1 MG/ML IJ SOLN
0.2500 mg | INTRAMUSCULAR | Status: DC | PRN
  Administered 2021-04-06: 0.5 mg via INTRAVENOUS

## 2021-04-06 MED ORDER — CEFAZOLIN SODIUM-DEXTROSE 2-4 GM/100ML-% IV SOLN
2.0000 g | Freq: Three times a day (TID) | INTRAVENOUS | Status: DC
  Administered 2021-04-07: 2 g via INTRAVENOUS
  Filled 2021-04-06 (×2): qty 100

## 2021-04-06 MED ORDER — MIDAZOLAM HCL 2 MG/2ML IJ SOLN
INTRAMUSCULAR | Status: AC
  Filled 2021-04-06: qty 2

## 2021-04-06 MED ORDER — ONDANSETRON HCL 4 MG/2ML IJ SOLN
INTRAMUSCULAR | Status: DC | PRN
  Administered 2021-04-06: 4 mg via INTRAVENOUS

## 2021-04-06 MED ORDER — VANCOMYCIN HCL 1000 MG IV SOLR
INTRAVENOUS | Status: DC | PRN
  Administered 2021-04-06 (×2): 1000 mg via TOPICAL

## 2021-04-06 MED ORDER — DEXAMETHASONE SODIUM PHOSPHATE 10 MG/ML IJ SOLN
INTRAMUSCULAR | Status: DC | PRN
  Administered 2021-04-06: 10 mg via INTRAVENOUS

## 2021-04-06 MED ORDER — POLYETHYLENE GLYCOL 3350 17 G PO PACK: 17.0000 g | PACK | Freq: Every day | ORAL | Status: DC | PRN

## 2021-04-06 MED ORDER — SODIUM CHLORIDE 0.9 % IV SOLN: INTRAVENOUS | Status: DC

## 2021-04-06 MED ORDER — LACTATED RINGERS IV SOLN: INTRAVENOUS | Status: DC | PRN

## 2021-04-06 MED ORDER — KETOROLAC TROMETHAMINE 15 MG/ML IJ SOLN
15.0000 mg | Freq: Four times a day (QID) | INTRAMUSCULAR | Status: AC
  Administered 2021-04-06 – 2021-04-08 (×5): 15 mg via INTRAVENOUS
  Filled 2021-04-06 (×5): qty 1

## 2021-04-06 MED ORDER — MEPERIDINE HCL 25 MG/ML IJ SOLN: 6.2500 mg | INTRAMUSCULAR | Status: DC | PRN

## 2021-04-06 MED ORDER — PROMETHAZINE HCL 25 MG/ML IJ SOLN: 6.2500 mg | INTRAMUSCULAR | Status: DC | PRN

## 2021-04-06 MED ORDER — ACETAMINOPHEN 10 MG/ML IV SOLN: 1000.0000 mg | Freq: Once | INTRAVENOUS | Status: DC | PRN

## 2021-04-06 MED ORDER — CEFAZOLIN SODIUM-DEXTROSE 1-4 GM/50ML-% IV SOLN
INTRAVENOUS | Status: DC | PRN
  Administered 2021-04-06 (×2): 2 g via INTRAVENOUS

## 2021-04-06 SURGICAL SUPPLY — 86 items
APPLICATOR CHLORAPREP 10.5 ORG (MISCELLANEOUS) ×3 IMPLANT
BAG COUNTER SPONGE SURGICOUNT (BAG) ×3 IMPLANT
BIT DRILL 2.5 QC 240 STER (BIT) ×3 IMPLANT
BIT DRILL CANN 4.5MM (BIT) ×4 IMPLANT
BIT DRILL FLUTED 3.2 (BIT) ×2 IMPLANT
BIT DRILL Q/COUPLING 1 (BIT) ×3 IMPLANT
BIT DRILL QC 2X140 (BIT) ×6 IMPLANT
BLADE CLIPPER SURG (BLADE) ×3 IMPLANT
BNDG ELASTIC 3X5.8 VLCR STR LF (GAUZE/BANDAGES/DRESSINGS) ×3 IMPLANT
BNDG ELASTIC 4X5.8 VLCR STR LF (GAUZE/BANDAGES/DRESSINGS) ×3 IMPLANT
CHLORAPREP W/TINT 26 (MISCELLANEOUS) ×3 IMPLANT
DERMABOND ADVANCED (GAUZE/BANDAGES/DRESSINGS) ×2
DERMABOND ADVANCED .7 DNX12 (GAUZE/BANDAGES/DRESSINGS) ×4 IMPLANT
DRAPE C-ARM 42X72 X-RAY (DRAPES) ×6 IMPLANT
DRAPE C-ARMOR (DRAPES) ×3 IMPLANT
DRAPE HALF SHEET 40X57 (DRAPES) ×9 IMPLANT
DRAPE INCISE IOBAN 66X45 STRL (DRAPES) ×6 IMPLANT
DRAPE ORTHO SPLIT 77X108 STRL (DRAPES) ×6
DRAPE SURG 17X23 STRL (DRAPES) ×18 IMPLANT
DRAPE SURG ORHT 6 SPLT 77X108 (DRAPES) ×4 IMPLANT
DRAPE U-SHAPE 47X51 STRL (DRAPES) ×3 IMPLANT
DRESSING MEPILEX FLEX 4X4 (GAUZE/BANDAGES/DRESSINGS) ×2 IMPLANT
DRILL BIT CANN 4.5MM (BIT) ×2
DRILL BIT FLUTED 3.2 (BIT) ×3
DRSG MEPILEX BORDER 4X12 (GAUZE/BANDAGES/DRESSINGS) ×3 IMPLANT
DRSG MEPILEX BORDER 4X8 (GAUZE/BANDAGES/DRESSINGS) ×6 IMPLANT
DRSG MEPILEX FLEX 4X4 (GAUZE/BANDAGES/DRESSINGS) ×3
DRSG MEPITEL 4X7.2 (GAUZE/BANDAGES/DRESSINGS) ×3 IMPLANT
DRSG TEGADERM 4X4.75 (GAUZE/BANDAGES/DRESSINGS) ×3 IMPLANT
ELECT REM PT RETURN 9FT ADLT (ELECTROSURGICAL) ×3
ELECTRODE REM PT RTRN 9FT ADLT (ELECTROSURGICAL) ×2 IMPLANT
GAUZE SPONGE 2X2 8PLY STRL LF (GAUZE/BANDAGES/DRESSINGS) ×2 IMPLANT
GLOVE SURG ENC MOIS LTX SZ6.5 (GLOVE) ×6 IMPLANT
GLOVE SURG ENC MOIS LTX SZ7.5 (GLOVE) ×9 IMPLANT
GLOVE SURG UNDER POLY LF SZ6.5 (GLOVE) ×3 IMPLANT
GLOVE SURG UNDER POLY LF SZ7.5 (GLOVE) ×3 IMPLANT
GOWN STRL REUS W/ TWL LRG LVL3 (GOWN DISPOSABLE) ×4 IMPLANT
GOWN STRL REUS W/TWL LRG LVL3 (GOWN DISPOSABLE) ×6
GUIDEWIRE 2.0MM (WIRE) ×9 IMPLANT
GUIDEWIRE THREADED 2.8 (WIRE) ×3 IMPLANT
GUIDEWIRE THREADED 2.8MM (WIRE) ×9 IMPLANT
KIT BASIN OR (CUSTOM PROCEDURE TRAY) ×3 IMPLANT
KIT TURNOVER KIT B (KITS) ×3 IMPLANT
LOOP VESSEL MAXI BLUE (MISCELLANEOUS) ×3 IMPLANT
MANIFOLD NEPTUNE II (INSTRUMENTS) ×3 IMPLANT
NS IRRIG 1000ML POUR BTL (IV SOLUTION) ×6 IMPLANT
PACK TOTAL JOINT (CUSTOM PROCEDURE TRAY) ×3 IMPLANT
PACK UNIVERSAL I (CUSTOM PROCEDURE TRAY) ×3 IMPLANT
PAD ARMBOARD 7.5X6 YLW CONV (MISCELLANEOUS) ×6 IMPLANT
PAD CAST 3X4 CTTN HI CHSV (CAST SUPPLIES) ×2 IMPLANT
PAD CAST 4YDX4 CTTN HI CHSV (CAST SUPPLIES) ×2 IMPLANT
PADDING CAST COTTON 3X4 STRL (CAST SUPPLIES) ×3
PADDING CAST COTTON 4X4 STRL (CAST SUPPLIES) ×3
PENCIL BUTTON HOLSTER BLD 10FT (ELECTRODE) ×3 IMPLANT
PLATE LOCKING 9 HOLE (Plate) ×3 IMPLANT
SCREW BONE CANN 7.3X75 HEX (Screw) ×3 IMPLANT
SCREW CANN 32 THRD/95 7.3 (Screw) ×3 IMPLANT
SCREW CANN FT 7.5X155 (Screw) ×3 IMPLANT
SCREW CANN FT LCP 7.5X155 (Screw) ×2 IMPLANT
SCREW CORTEX ST 4.5X26 (Screw) ×6 IMPLANT
SCREW CORTEX ST 4.5X28 (Screw) ×12 IMPLANT
SCREW CORTEX ST 4.5X30 (Screw) ×3 IMPLANT
SCREW METAPHYSEAL 2.7X50 (Screw) ×6 IMPLANT
SCREW SHANZ 5.0X170 (Screw) ×3 IMPLANT
SCREW STARDRV ST VA 2.7X52 (Screw) ×3 IMPLANT
SLING ARM FOAM STRAP LRG (SOFTGOODS) ×3 IMPLANT
SPONGE GAUZE 2X2 STER 10/PKG (GAUZE/BANDAGES/DRESSINGS) ×1
SPONGE T-LAP 18X18 ~~LOC~~+RFID (SPONGE) ×12 IMPLANT
STAPLER VISISTAT 35W (STAPLE) ×3 IMPLANT
SUCTION FRAZIER HANDLE 10FR (MISCELLANEOUS) ×6
SUCTION TUBE FRAZIER 10FR DISP (MISCELLANEOUS) ×4 IMPLANT
SUT MNCRL AB 3-0 PS2 18 (SUTURE) ×9 IMPLANT
SUT MON AB 2-0 CT1 36 (SUTURE) ×3 IMPLANT
SUT VIC AB 0 CT1 27 (SUTURE) ×6
SUT VIC AB 0 CT1 27XBRD ANBCTR (SUTURE) ×4 IMPLANT
SUT VIC AB 1 CT1 18XCR BRD 8 (SUTURE) IMPLANT
SUT VIC AB 1 CT1 8-18 (SUTURE)
SUT VIC AB 2-0 CT1 27 (SUTURE) ×6
SUT VIC AB 2-0 CT1 TAPERPNT 27 (SUTURE) ×4 IMPLANT
TOWEL GREEN STERILE (TOWEL DISPOSABLE) ×6 IMPLANT
TOWEL GREEN STERILE FF (TOWEL DISPOSABLE) ×3 IMPLANT
TRAY FOLEY MTR SLVR 16FR STAT (SET/KITS/TRAYS/PACK) ×3 IMPLANT
TUBING CONNECTING 10 (TUBING) ×6 IMPLANT
WASHER FOR 5.0 SCREWS (Washer) ×6 IMPLANT
WATER STERILE IRR 1000ML POUR (IV SOLUTION) ×12 IMPLANT
YANKAUER SUCT BULB TIP NO VENT (SUCTIONS) ×6 IMPLANT
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
## 2021-04-06 NOTE — Progress Notes (Signed)
Ortho Trauma Note  Will attempt to fix pelvis+/- humerus today. Patient made NPO for possible surgery later today. Please keep NPO.  Shona Needles, MD [facility] [phone] (office) [email or web address]
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
## 2021-04-06 NOTE — Transfer of Care (Signed)
Immediate Anesthesia Transfer of Care Note  Patient: Jerry Cordova  Procedure(s) Performed: OPEN REDUCTION INTERNAL FIXATION (ORIF) PELVIC FRACTURE (Pelvis) OPEN REDUCTION INTERNAL FIXATION (ORIF) DISTAL HUMERUS FRACTURE (Left: Elbow)  Patient Location: PACU  Anesthesia Type:General  Level of Consciousness: awake, alert  and oriented  Airway & Oxygen Therapy: Patient Spontanous Breathing and Patient connected to face mask oxygen  Post-op Assessment: Report given to RN and Post -op Vital signs reviewed and stable  Post vital signs: Reviewed and stable  Last Vitals:  Vitals Value Taken Time  BP 153/130 04/06/21 1925  Temp    Pulse 110 04/06/21 1932  Resp 15 04/06/21 1932  SpO2 98 % 04/06/21 1932  Vitals shown include unvalidated device data.  Last Pain:  Vitals:   04/06/21 1200  TempSrc: Oral  PainSc:       Patients Stated Pain Goal: 0 (36/[id number]/94 [id number])  Complications: No notable events documented.
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
## 2021-04-06 NOTE — Anesthesia Postprocedure Evaluation (Signed)
Anesthesia Post Note  Patient: Jerry Cordova  Procedure(s) Performed: OPEN REDUCTION INTERNAL FIXATION (ORIF) PELVIC FRACTURE (Pelvis) OPEN REDUCTION INTERNAL FIXATION (ORIF) DISTAL HUMERUS FRACTURE (Left: Elbow)     Patient location during evaluation: PACU Anesthesia Type: General Level of consciousness: awake and alert Pain management: pain level controlled Vital Signs Assessment: post-procedure vital signs reviewed and stable Respiratory status: spontaneous breathing, nonlabored ventilation, respiratory function stable and patient connected to nasal cannula oxygen Cardiovascular status: blood pressure returned to baseline and stable Postop Assessment: no apparent nausea or vomiting Anesthetic complications: no   No notable events documented.  Last Vitals:  Vitals:   04/06/21 2030 04/06/21 2100  BP: (!) 151/71 (!) 143/59  Pulse: (!) 110 (!) 109  Resp: 14 15  Temp: 37.7 C 36.9 C  SpO2: 96% 96%    Last Pain:  Vitals:   04/06/21 2100  TempSrc: Oral  PainSc:                  Tiajuana Amass
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
## 2021-04-06 NOTE — Anesthesia Procedure Notes (Addendum)
Procedure Name: Intubation Date/Time: 04/06/2021 1:20 PM Performed by: Claris Che, CRNA Pre-anesthesia Checklist: Patient identified, Emergency Drugs available, Suction available and Patient being monitored Patient Re-evaluated:Patient Re-evaluated prior to induction Oxygen Delivery Method: Circle System Utilized Preoxygenation: Pre-oxygenation with 100% oxygen Induction Type: IV induction and Cricoid Pressure applied Ventilation: Mask ventilation without difficulty Laryngoscope Size: Mac and 4 Grade View: Grade II Tube type: Oral Tube size: 7.5 mm Number of attempts: 1 Airway Equipment and Method: Stylet and Oral airway Placement Confirmation: ETT inserted through vocal cords under direct vision, positive ETCO2 and breath sounds checked- equal and bilateral Secured at: 23 cm Tube secured with: Tape Dental Injury: Teeth and Oropharynx as per pre-operative assessment

## 2021-04-07 ENCOUNTER — Encounter (HOSPITAL_COMMUNITY): Payer: Self-pay | Admitting: Student

## 2021-04-07 ENCOUNTER — Inpatient Hospital Stay (HOSPITAL_COMMUNITY): Payer: BLUE CROSS/BLUE SHIELD

## 2021-04-07 LAB — CBC
HCT: 15.8 % — ABNORMAL LOW (ref 39.0–52.0)
Hemoglobin: 5.2 g/dL — CL (ref 13.0–17.0)
MCH: 27.5 pg (ref 26.0–34.0)
MCHC: 32.9 g/dL (ref 30.0–36.0)
MCV: 83.6 fL (ref 80.0–100.0)
Platelets: 199 10*3/uL (ref 150–400)
RBC: 1.89 MIL/uL — ABNORMAL LOW (ref 4.22–5.81)
RDW: 13.7 % (ref 11.5–15.5)
WBC: 12.9 10*3/uL — ABNORMAL HIGH (ref 4.0–10.5)
nRBC: 0 % (ref 0.0–0.2)

## 2021-04-07 LAB — BASIC METABOLIC PANEL
Anion gap: 10 (ref 5–15)
BUN: 13 mg/dL (ref 6–20)
CO2: 24 mmol/L (ref 22–32)
Calcium: 7.5 mg/dL — ABNORMAL LOW (ref 8.9–10.3)
Chloride: 96 mmol/L — ABNORMAL LOW (ref 98–111)
Creatinine, Ser: 1.09 mg/dL (ref 0.61–1.24)
GFR, Estimated: >60 mL/min (ref >60)
Glucose, Bld: 274 mg/dL — ABNORMAL HIGH (ref 70–99)
Potassium: 4.3 mmol/L (ref 3.5–5.1)
Sodium: 130 mmol/L — ABNORMAL LOW (ref 135–145)

## 2021-04-07 LAB — HEMOGLOBIN AND HEMATOCRIT, BLOOD
HCT: 21.5 % — ABNORMAL LOW (ref 39.0–52.0)
Hemoglobin: 7.4 g/dL — ABNORMAL LOW (ref 13.0–17.0)

## 2021-04-07 LAB — ABO/RH: ABO/RH(D): A NEG

## 2021-04-07 LAB — PREPARE RBC (CROSSMATCH)

## 2021-04-07 LAB — NASOPHARYNGEAL CULTURE
Culture: NORMAL
Special Requests: NORMAL

## 2021-04-07 LAB — VITAMIN D 25 HYDROXY (VIT D DEFICIENCY, FRACTURES): Vit D, 25-Hydroxy: 41.63 ng/mL (ref 30–100)

## 2021-04-07 MED ORDER — SODIUM CHLORIDE 0.9% IV SOLUTION: Freq: Once | INTRAVENOUS | Status: AC

## 2021-04-07 MED ORDER — CEFAZOLIN SODIUM-DEXTROSE 2-4 GM/100ML-% IV SOLN
2.0000 g | Freq: Three times a day (TID) | INTRAVENOUS | Status: AC
Start: 2021-04-07 — End: 2021-04-07
  Administered 2021-04-07 (×2): 2 g via INTRAVENOUS
  Filled 2021-04-07 (×2): qty 100

## 2021-04-07 MED ORDER — ACETAMINOPHEN 500 MG PO TABS
1000.0000 mg | ORAL_TABLET | Freq: Four times a day (QID) | ORAL | Status: DC
  Administered 2021-04-07 – 2021-04-15 (×29): 1000 mg via ORAL
  Filled 2021-04-07 (×31): qty 2

## 2021-04-07 NOTE — Progress Notes (Signed)
PT Cancellation Note  Patient Details Name: Jerry Cordova MRN: [id number] DOB: [birth year]   Cancelled Treatment:    Reason Eval/Treat Not Completed: Medical issues which prohibited therapy Hgb 5.2/HCT 15.8, discussed with RN who requests we hold for now. Will attempt to return later in day if time/schedule allow and if he is medically ready.   Windell Norfolk, DPT, PN2   Supplemental Physical Therapist [place]    Pager [phone] Acute Rehab Office [phone]
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
## 2021-04-07 NOTE — Progress Notes (Signed)
OT Cancellation Note  Patient Details Name: Jerry Cordova MRN: [id number] DOB: 01-26-2001   Cancelled Treatment:    Reason Eval/Treat Not Completed: Patient not medically ready (due to hgb level and speaking with nurse)  Joeseph Amor OTR/L  Acute Rehab Services  [phone] office number [phone] pager number  Joeseph Amor 04/07/2021, 8:56 AM
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
## 2021-04-07 NOTE — Progress Notes (Signed)
   ORTHOPAEDIC PROGRESS NOTE  s/p Procedure(s): Left femur retrograde nailing and open fracture incision debridement on 04/05/2021 with Dr. Griffin Basil  ORIF pelvic fracture and ORIF left midshaft and distal humerus fracture on 8/22 with Dr. Doreatha Martin  SUBJECTIVE:/ Reports mild pain about operative site. No chest pain. No SOB. No nausea/vomiting. No other complaints.  OBJECTIVE: PE: Left upper extremity: dressing CDI, sling well fitting,  + Motor in  AIN, PIN, Ulnar distributions. Sensation intact in medial, radial, and ulnar distributions. Well perfused digits.  Left lower extremity: dressings CDI, intact EHL/TA/GSC, compartments soft and compressible, endorses distal sensation, warm well perfused foot Right lower extremity: intact EHL/TA/GSC, compartments soft and compressible, endorses distal sensation, warm well perfused foot  Vitals:   04/07/21 0004 04/07/21 0438  BP: 130/60 (!) 153/67  Pulse: (!) 104 (!) 101  Resp: 16 17  Temp: 98.5 F (36.9 C) 98.4 F (36.9 C)  SpO2: 96% 100%     ASSESSMENT: Jerry Cordova is a 20 y.o. male doing well postoperatively.  PLAN: Weightbearing: WBAT LLE, NWB LUE, NWB RLE ROM:  - RLE: Okay for unrestricted hip and knee motion as tolerated - LLE: Okay for unrestricted ROM - LUE: Okay to begin gentle passive elbow motion with therapies Incisional and dressing care: Plan to change dressings tomorrow Showering: Hold off for now. Orthopedic device(s): Sling LUE Pain management:  1. Tylenol 650 mg q 6 hours scheduled 2. Robaxin 500 mg q 6 hours PRN 3. Oxycodone 5-15 mg q 4 hours PRN 4. Neurontin 300 mg TID 5. Dilaudid 0.5-1 mg q 4 hours PRN 5. Toradol 15 mg q 6 hours x 5 doses VTE prophylaxis: Hold Lovenox until hemoglobin stabilizes, continue SCDs  Follow - up plan: PT/OT evals pending.  Contact information: After hours and holidays please check [email or web address] for group call information for [facility]  Noemi Chapel, PA-C 04/07/2021

## 2021-04-08 ENCOUNTER — Encounter (HOSPITAL_COMMUNITY): Payer: Self-pay | Admitting: Student

## 2021-04-08 LAB — TYPE AND SCREEN
ABO/RH(D): A NEG
Antibody Screen: NEGATIVE
Unit division: 0
Unit division: 0

## 2021-04-08 LAB — HEMOGLOBIN AND HEMATOCRIT, BLOOD
HCT: 22.8 % — ABNORMAL LOW (ref 39.0–52.0)
Hemoglobin: 7.6 g/dL — ABNORMAL LOW (ref 13.0–17.0)

## 2021-04-08 LAB — CBC
HCT: 20.9 % — ABNORMAL LOW (ref 39.0–52.0)
Hemoglobin: 7.1 g/dL — ABNORMAL LOW (ref 13.0–17.0)
MCH: 28.5 pg (ref 26.0–34.0)
MCHC: 34 g/dL (ref 30.0–36.0)
MCV: 83.9 fL (ref 80.0–100.0)
Platelets: 233 10*3/uL (ref 150–400)
RBC: 2.49 MIL/uL — ABNORMAL LOW (ref 4.22–5.81)
RDW: 14.2 % (ref 11.5–15.5)
WBC: 12.4 10*3/uL — ABNORMAL HIGH (ref 4.0–10.5)
nRBC: 0 % (ref 0.0–0.2)

## 2021-04-08 LAB — BASIC METABOLIC PANEL
Anion gap: 5 (ref 5–15)
BUN: 11 mg/dL (ref 6–20)
CO2: 31 mmol/L (ref 22–32)
Calcium: 8 mg/dL — ABNORMAL LOW (ref 8.9–10.3)
Chloride: 102 mmol/L (ref 98–111)
Creatinine, Ser: 0.8 mg/dL (ref 0.61–1.24)
GFR, Estimated: >60 mL/min (ref >60)
Glucose, Bld: 124 mg/dL — ABNORMAL HIGH (ref 70–99)
Potassium: 3.7 mmol/L (ref 3.5–5.1)
Sodium: 138 mmol/L (ref 135–145)

## 2021-04-08 LAB — BPAM RBC
Blood Product Expiration Date: 202209142359
Blood Product Expiration Date: 202209152359
ISSUE DATE / TIME: 202208230857
ISSUE DATE / TIME: 202208231132
Unit Type and Rh: 600
Unit Type and Rh: 600

## 2021-04-08 MED ORDER — FERROUS SULFATE 325 (65 FE) MG PO TABS
325.0000 mg | ORAL_TABLET | Freq: Three times a day (TID) | ORAL | Status: DC
  Administered 2021-04-08 – 2021-04-15 (×22): 325 mg via ORAL
  Filled 2021-04-08 (×22): qty 1

## 2021-04-08 MED ORDER — POTASSIUM CHLORIDE CRYS ER 20 MEQ PO TBCR
40.0000 meq | EXTENDED_RELEASE_TABLET | Freq: Once | ORAL | Status: AC
  Administered 2021-04-08: 40 meq via ORAL
  Filled 2021-04-08: qty 2

## 2021-04-08 MED ORDER — GUAIFENESIN ER 600 MG PO TB12
600.0000 mg | ORAL_TABLET | Freq: Two times a day (BID) | ORAL | Status: AC
  Administered 2021-04-08 – 2021-04-10 (×5): 600 mg via ORAL
  Filled 2021-04-08 (×5): qty 1

## 2021-04-08 MED ORDER — MAGNESIUM HYDROXIDE 400 MG/5ML PO SUSP: 15.0000 mL | Freq: Once | ORAL | Status: DC

## 2021-04-08 MED ORDER — METHOCARBAMOL 500 MG PO TABS
500.0000 mg | ORAL_TABLET | Freq: Four times a day (QID) | ORAL | Status: DC
  Administered 2021-04-08 – 2021-04-15 (×29): 500 mg via ORAL
  Filled 2021-04-08 (×29): qty 1

## 2021-04-08 MED ORDER — ASCORBIC ACID 500 MG PO TABS
500.0000 mg | ORAL_TABLET | Freq: Every day | ORAL | Status: DC
  Administered 2021-04-08 – 2021-04-15 (×8): 500 mg via ORAL
  Filled 2021-04-08 (×7): qty 1

## 2021-04-08 MED ORDER — POLYETHYLENE GLYCOL 3350 17 G PO PACK
17.0000 g | PACK | Freq: Two times a day (BID) | ORAL | Status: DC
  Administered 2021-04-08 – 2021-04-12 (×6): 17 g via ORAL
  Filled 2021-04-08 (×14): qty 1
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
## 2021-04-09 LAB — BASIC METABOLIC PANEL
Anion gap: 6 (ref 5–15)
BUN: 11 mg/dL (ref 6–20)
CO2: 30 mmol/L (ref 22–32)
Calcium: 8.4 mg/dL — ABNORMAL LOW (ref 8.9–10.3)
Chloride: 101 mmol/L (ref 98–111)
Creatinine, Ser: 0.72 mg/dL (ref 0.61–1.24)
GFR, Estimated: >60 mL/min (ref >60)
Glucose, Bld: 107 mg/dL — ABNORMAL HIGH (ref 70–99)
Potassium: 4.2 mmol/L (ref 3.5–5.1)
Sodium: 137 mmol/L (ref 135–145)

## 2021-04-09 LAB — CBC
HCT: 22.7 % — ABNORMAL LOW (ref 39.0–52.0)
Hemoglobin: 7.5 g/dL — ABNORMAL LOW (ref 13.0–17.0)
MCH: 28.2 pg (ref 26.0–34.0)
MCHC: 33 g/dL (ref 30.0–36.0)
MCV: 85.3 fL (ref 80.0–100.0)
Platelets: 363 10*3/uL (ref 150–400)
RBC: 2.66 MIL/uL — ABNORMAL LOW (ref 4.22–5.81)
RDW: 14.5 % (ref 11.5–15.5)
WBC: 11.2 10*3/uL — ABNORMAL HIGH (ref 4.0–10.5)
nRBC: 0 % (ref 0.0–0.2)

## 2021-04-09 LAB — MAGNESIUM: Magnesium: 1.8 mg/dL (ref 1.7–2.4)

## 2021-04-09 MED ORDER — MAGNESIUM HYDROXIDE 400 MG/5ML PO SUSP
15.0000 mL | Freq: Once | ORAL | Status: AC
  Administered 2021-04-09: 15 mL via ORAL
  Filled 2021-04-09: qty 30

## 2021-04-09 MED ORDER — MAGNESIUM HYDROXIDE 400 MG/5ML PO SUSP
30.0000 mL | Freq: Once | ORAL | Status: AC
  Administered 2021-04-09: 30 mL via ORAL
  Filled 2021-04-09: qty 30
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
## 2021-04-09 NOTE — Progress Notes (Signed)
IP rehab admissions - Received inpatient rehab consult.  Noted patient positive for Covid on 04/05/21 and currently on Airborne/contact precautions.  Once patient is off of Airborne/Covid precautions, then we can consider him for CIR admission.  Call me for questions.  [phone]

## 2021-04-09 NOTE — Progress Notes (Addendum)
Patient reports positive COVID test on 03/31/21. He has pictures of positive test results on his phone from 04/03/21. He reports that his mother has a picture of the earlier test - he will try to have her send this to him today. Discussed with Dr. Baxter Flattery of infectious disease, he can come off of airborne and contact precautions 04/10/21 if he has a picture of the positive result from 03/31/21. If he does not then it would seem we need to wait 10 days from 04/03/21 which would be 04/13/21.   Norm Parcel, [facility] 04/09/2021, 3:42 PM Please see Amion for pager number during day hours 7:00am-4:30pm
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
## 2021-04-10 MED ORDER — BISACODYL 10 MG RE SUPP
10.0000 mg | Freq: Once | RECTAL | Status: AC
  Administered 2021-04-10: 10 mg via RECTAL
  Filled 2021-04-10: qty 1

## 2021-04-10 MED ORDER — MAGNESIUM HYDROXIDE 400 MG/5ML PO SUSP
30.0000 mL | Freq: Once | ORAL | Status: AC
  Administered 2021-04-10: 30 mL via ORAL
  Filled 2021-04-10: qty 30

## 2021-04-10 NOTE — TOC Progression Note (Signed)
Transition of Care [facility] - Progression Note    Patient Details  Name: ADIB WAHBA MRN: [id number] Date of Birth: Sep 11, 2000  Transition of Care [facility]) CM/SW [place], [place] Phone Number: 04/10/2021, 3:49 PM  Clinical Narrative:    CSW faxed referral to inpatient rehab at [facility]. Fax # is [phone], Attn to Sierra Brooks.   Expected Discharge Plan: IP Rehab Facility Barriers to Discharge: Continued Medical Work up  Expected Discharge Plan and Services Expected Discharge Plan: [place]   Discharge Planning Services: CM Consult   Living arrangements for the past 2 months: Single Family Home                                       Social Determinants of Health (SDOH) Interventions    Readmission Risk Interventions No flowsheet data found.
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
## 2021-04-10 NOTE — Progress Notes (Signed)
[facility] IP rehab admissions - I received a call back from Amy at [facility].  Patient has a policy that says that [facility] rehab in [place] is in network for patient.  We will need to contact the admissions person, Jocelyn Lamer, at [phone] and give them the referral for rehab.  If they then turn down the patient, then we can pursue auth with BC[employer].  Patient has [place] with [facility] [employer]  policy # [id number].  I do have the benefits if needed.  Please call me for questions.  [phone]

## 2021-04-10 NOTE — Progress Notes (Signed)
IP rehab admissions - I received a call from [facility] stating that they are not the primary insurance.  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  I called patient's mom and I met briefly with patient.  Patient to return my call about [employer] plan.  I will need to precert with [employer] if patient can provide that information.  This will delay when I can potentially offer a rehab admission to patient.  Call for questions. # [phone]  I met with patient's mom at 12:00 noon and I now have the BCBS information.  I will begin the precert process and I will get [employer] information to the admitting office to update insurance information.
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
## 2021-04-11 MED ORDER — DIPHENHYDRAMINE-ZINC ACETATE 2-0.1 % EX CREA
TOPICAL_CREAM | Freq: Three times a day (TID) | CUTANEOUS | Status: DC | PRN
  Filled 2021-04-11: qty 28
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
## 2021-04-11 NOTE — Progress Notes (Addendum)
   04/11/21 1952  Provider Notification  Provider Name/Title Lovick  Date Provider Notified 04/11/21  Time Provider Notified 1952  Notification Type Page  Notification Reason Other (Comment) (c/o itching on right lower leg)  Provider response Other (Comment) (waiting for response)   2040: New order noted.

## 2021-04-12 LAB — CREATININE, SERUM
Creatinine, Ser: 0.58 mg/dL — ABNORMAL LOW (ref 0.61–1.24)
GFR, Estimated: >60 mL/min (ref >60)

## 2021-04-12 MED ORDER — BISACODYL 10 MG RE SUPP
10.0000 mg | Freq: Once | RECTAL | Status: AC
  Administered 2021-04-12: 10 mg via RECTAL
  Filled 2021-04-12: qty 1
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
## 2021-04-13 NOTE — TOC Progression Note (Signed)
Transition of Care [facility]) - Progression Note    Patient Details  Name: Jerry Cordova MRN: [id number] Date of Birth: 2001-02-09  Transition of Care [facility]) CM/SW Contact  Emeterio Reeve, [place] Phone Number: 04/13/2021, 4:00 PM  Clinical Narrative:     CSW received phone call from Shanon Brow at [facility]. Shanon Brow requested updated clinicals, progress notes, PT/OT notes and MAR to be faxed to [phone].   CSW will continue to follow for updates.  Expected Discharge Plan: IP Rehab Facility Barriers to Discharge: Continued Medical Work up  Expected Discharge Plan and Services Expected Discharge Plan: [place]   Discharge Planning Services: CM Consult   Living arrangements for the past 2 months: Single Family Home                                       Social Determinants of Health (SDOH) Interventions    Readmission Risk Interventions No flowsheet data found.  Emeterio Reeve,  Clinical Social Worker [phone]
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
## 2021-04-13 NOTE — Discharge Instructions (Addendum)
Shoulder and pelvic sutures are dissolvable and do not need to be removed.  The knee/femur nylons can be removed by Dr. Griffin Basil at follow up although if follow up is a couple of weeks after his appointment, these should be removed prior to discharge at the end of his stay.     Orthopaedic Trauma Service Discharge Instructions   General Discharge Instructions  WEIGHT BEARING STATUS:  - LLE: weightbearing as tolerated  - RLE: Non-weightbearing - RUE: Non-weightbearing  RANGE OF MOTION/ACTIVITY: - RLE: Okay for unrestricted hip and knee motion as tolerated - LLE: Okay for unrestricted ROM - LUE: Okay for gentle passive elbow motion with therapies  Wound Care: Leave Aquacel dressing in place over left knee until follow-up with surgeon. All other incisions can be left open to air if there is no drainage. If incision continues to have drainage, follow wound care instructions below. Okay to shower if no drainage from incisions. Aquacel dressings may get wet  DVT/PE prophylaxis: Lovenox x 14 days  Diet: as you were eating previously.  Can use over the counter stool softeners and bowel preparations, such as Miralax, to help with bowel movements.  Narcotics can be constipating.  Be sure to drink plenty of fluids  PAIN MEDICATION USE AND EXPECTATIONS  You have likely been given narcotic medications to help control your pain.  After a traumatic event that results in an fracture (broken bone) with or without surgery, it is ok to use narcotic pain medications to help control one's pain.  We understand that everyone responds to pain differently and each individual patient will be evaluated on a regular basis for the continued need for narcotic medications. Ideally, narcotic medication use should last no more than 6-8 weeks (coinciding with fracture healing).   As a patient it is your responsibility as well to monitor narcotic medication use and report the amount and frequency you use these medications  when you come to your office visit.   We would also advise that if you are using narcotic medications, you should take a dose prior to therapy to maximize you participation.  IF YOU ARE ON NARCOTIC MEDICATIONS IT IS NOT PERMISSIBLE TO OPERATE A MOTOR VEHICLE (MOTORCYCLE/CAR/TRUCK/MOPED) OR HEAVY MACHINERY DO NOT MIX NARCOTICS WITH OTHER CNS (CENTRAL NERVOUS SYSTEM) DEPRESSANTS SUCH AS ALCOHOL   STOP SMOKING OR USING NICOTINE PRODUCTS!!!!  As discussed nicotine severely impairs your body's ability to heal surgical and traumatic wounds but also impairs bone healing.  Wounds and bone heal by forming microscopic blood vessels (angiogenesis) and nicotine is a vasoconstrictor (essentially, shrinks blood vessels).  Therefore, if vasoconstriction occurs to these microscopic blood vessels they essentially disappear and are unable to deliver necessary nutrients to the healing tissue.  This is one modifiable factor that you can do to dramatically increase your chances of healing your injury.    (This means no smoking, no nicotine gum, patches, etc)  DO NOT USE NONSTEROIDAL ANTI-INFLAMMATORY DRUGS (NSAID'S)  Using products such as Advil (ibuprofen), Aleve (naproxen), Motrin (ibuprofen) for additional pain control during fracture healing can delay and/or prevent the healing response.  If you would like to take over the counter (OTC) medication, Tylenol (acetaminophen) is ok.  However, some narcotic medications that are given for pain control contain acetaminophen as well. Therefore, you should not exceed more than 4000 mg of tylenol in a day if you do not have liver disease.  Also note that there are may OTC medicines, such as cold medicines and allergy medicines that  my contain tylenol as well.  If you have any questions about medications and/or interactions please ask your doctor/PA or your pharmacist.      ICE AND ELEVATE INJURED/OPERATIVE EXTREMITY  Using ice and elevating the injured extremity above your  heart can help with swelling and pain control.  Icing in a pulsatile fashion, such as 20 minutes on and 20 minutes off, can be followed.    Do not place ice directly on skin. Make sure there is a barrier between to skin and the ice pack.    Using frozen items such as frozen peas works well as the conform nicely to the are that needs to be iced.  USE AN ACE WRAP OR TED HOSE FOR SWELLING CONTROL  In addition to icing and elevation, Ace wraps or TED hose are used to help limit and resolve swelling.  It is recommended to use Ace wraps or TED hose until you are informed to stop.    When using Ace Wraps start the wrapping distally (farthest away from the body) and wrap proximally (closer to the body)   Example: If you had surgery on your leg or thing and you do not have a splint on, start the ace wrap at the toes and work your way up to the thigh        If you had surgery on your upper extremity and do not have a splint on, start the ace wrap at your fingers and work your way up to the upper arm   [place]: [phone]   VISIT OUR WEBSITE FOR ADDITIONAL INFORMATION: [email or web address]    Discharge Wound Care Instructions  Do NOT apply any ointments, solutions or lotions to pin sites or surgical wounds.  These prevent needed drainage and even though solutions like hydrogen peroxide kill bacteria, they also damage cells lining the pin sites that help fight infection.  Applying lotions or ointments can keep the wounds moist and can cause them to breakdown and open up as well. This can increase the risk for infection. When in doubt call the office.  Surgical incisions should be dressed daily.  If any drainage is noted, use one layer of adaptic, then gauze, Kerlix, and an ace wrap.  Once the incision is completely dry and without drainage, it may be left open to air out.  Showering may begin 36-48 hours later.  Cleaning gently with soap and water.  Traumatic  wounds should be dressed daily as well.    One layer of adaptic, gauze, Kerlix, then ace wrap.  The adaptic can be discontinued once the draining has ceased    If you have a wet to dry dressing: wet the gauze with saline the squeeze as much saline out so the gauze is moist (not soaking wet), place moistened gauze over wound, then place a dry gauze over the moist one, followed by Kerlix wrap, then ace wrap.
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
## 2021-04-14 LAB — RESP PANEL BY RT-PCR (FLU A&B, COVID) ARPGX2
Influenza A by PCR: NEGATIVE
Influenza B by PCR: NEGATIVE
SARS Coronavirus 2 by RT PCR: POSITIVE — AB
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
## 2021-04-14 NOTE — TOC Progression Note (Signed)
Transition of Care [facility]) - Progression Note    Patient Details  Name: Jerry Cordova MRN: [id number] Date of Birth: 29-Nov-2000  Transition of Care [facility]) CM/SW Contact  Emeterio Reeve, [place] Phone Number: 04/14/2021, 4:05 PM  Clinical Narrative:     [facility]- IR is waiting on insurance auth. They or hopeful that they will have auth by tomorrow morning. Pt will need to be at [facility] by 2pm. CSW requested covid test from [place].   CSW faxed updated clinicals. Tomorrow pt will go to [address] Algie Coffer [place] [address]. Call to report number will be [phone].  Expected Discharge Plan: IP Rehab Facility Barriers to Discharge: Continued Medical Work up  Expected Discharge Plan and Services Expected Discharge Plan: [place]   Discharge Planning Services: CM Consult   Living arrangements for the past 2 months: Single Family Home                                       Social Determinants of Health (SDOH) Interventions    Readmission Risk Interventions No flowsheet data found.  Emeterio Reeve, LCSW Clinical Social Worker
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
## 2021-04-14 NOTE — Progress Notes (Signed)
   04/14/21 1405  Clinical Encounter Type  Visited With Patient and family together  Visit Type Initial  Referral From Nurse  Consult/Referral To Chaplain   Chaplain received call stating patient is requesting a document notarized. The patient's mother and other family member were at his bedside. The patient's mother said she needs to handle a bank situation and needs POA notarized. This chaplain advised [facility] notary only notarized Advance Directive/HealthCare POA. This Chaplain educated patient and mother that the patient's signature needs to be written in front of notary and he can contact his bank to inquire about notary. This note was prepared by Jeanine Luz, M.Div..  For questions please contact by phone [phone].

## 2021-04-14 NOTE — Plan of Care (Signed)
Alert and oriented. Found in recliner at start of shift and assisted back to bed. C/o occasional pain, treated with prn medication. On room air. Good urine output. No BM this shift.   Problem: Activity: Goal: Risk for activity intolerance will decrease Outcome: Progressing   Problem: Pain Managment: Goal: General experience of comfort will improve Outcome: Progressing

## 2021-04-14 NOTE — Progress Notes (Signed)
Inpatient Rehab Admissions Coordinator:   Pt. Has been accepted to [facility] rehab. CIR will sign off.   Clemens Catholic, [place], [place] Admissions Coordinator  [phone] ([place]) [phone] (office)

## 2021-04-14 NOTE — Progress Notes (Signed)
Orthopedic Tech Progress Note Patient Details:  Jerry Cordova [birth year] [id number]  Ortho Devices Type of Ortho Device: Prafo boot/shoe Ortho Device/Splint Location: Left foot Ortho Device/Splint Interventions: Application   Post Interventions Patient Tolerated: Well Instructions Provided: Care of device, Adjustment of device  Vauda Salvucci E Maylea Soria 04/14/2021, 5:11 PM

## 2021-04-15 MED ORDER — POLYETHYLENE GLYCOL 3350 17 G PO PACK: 17.0000 g | PACK | Freq: Two times a day (BID) | ORAL | 0 refills | Status: DC

## 2021-04-15 MED ORDER — FERROUS SULFATE 325 (65 FE) MG PO TABS: 325.0000 mg | ORAL_TABLET | Freq: Three times a day (TID) | ORAL | 3 refills | Status: DC

## 2021-04-15 MED ORDER — DOCUSATE SODIUM 100 MG PO CAPS: 100.0000 mg | ORAL_CAPSULE | Freq: Two times a day (BID) | ORAL | 0 refills | Status: DC

## 2021-04-15 MED ORDER — OXYCODONE HCL 10 MG PO TABS: 10.0000 mg | ORAL_TABLET | ORAL | 0 refills | Status: DC | PRN

## 2021-04-15 MED ORDER — METHOCARBAMOL 500 MG PO TABS: 500.0000 mg | ORAL_TABLET | Freq: Four times a day (QID) | ORAL | Status: DC

## 2021-04-15 MED ORDER — ASCORBIC ACID 500 MG PO TABS
500.0000 mg | ORAL_TABLET | Freq: Every day | ORAL | Status: DC
Start: 2021-04-15 — End: 2021-08-06

## 2021-04-15 MED ORDER — OXYCODONE HCL 5 MG PO TABS: 5.0000 mg | ORAL_TABLET | ORAL | 0 refills | Status: DC | PRN

## 2021-04-15 MED ORDER — ACETAMINOPHEN 500 MG PO TABS
1000.0000 mg | ORAL_TABLET | Freq: Four times a day (QID) | ORAL | 0 refills | Status: DC
Start: 2021-04-15 — End: 2021-08-06

## 2021-04-15 MED ORDER — ONDANSETRON HCL 4 MG PO TABS: 4.0000 mg | ORAL_TABLET | Freq: Four times a day (QID) | ORAL | 0 refills | Status: DC | PRN

## 2021-04-15 MED ORDER — DIPHENHYDRAMINE-ZINC ACETATE 2-0.1 % EX CREA: TOPICAL_CREAM | Freq: Three times a day (TID) | CUTANEOUS | 0 refills | Status: DC | PRN

## 2021-04-15 MED ORDER — GABAPENTIN 300 MG PO CAPS: 300.0000 mg | ORAL_CAPSULE | Freq: Three times a day (TID) | ORAL | Status: DC

## 2021-04-15 MED ORDER — ENOXAPARIN SODIUM 30 MG/0.3ML IJ SOSY: 30.0000 mg | PREFILLED_SYRINGE | Freq: Two times a day (BID) | INTRAMUSCULAR | Status: DC
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
## 2021-04-15 NOTE — TOC Transition Note (Signed)
Transition of Care [facility]) - CM/SW Discharge Note   Patient Details  Name: Jerry Cordova MRN: [id number] Date of Birth: 2001/06/02  Transition of [facility]) CM/SW Contact:  Emeterio Reeve, LCSW Phone Number: 04/15/2021, 11:29 AM   Clinical Narrative:     Pt will DC to [facility] inpatient rehab, address [address] [place] [address]. [facility] has been called.  Nurse to call report to [phone]  Final next level of care: IP Rehab Facility Barriers to Discharge: Barriers Resolved   Patient Goals and CMS Choice        Discharge Placement              Patient chooses bed at: Other - please specify in the comment section below: [facility] [address] [place] [address]) Patient to be transferred to facility by: [facility]   Patient and family notified of of transfer: 04/15/21  Discharge Plan and Services   Discharge Planning Services: CM Consult                                 Social Determinants of Health (SDOH) Interventions     Readmission Risk Interventions No flowsheet data found.

## 2021-04-15 NOTE — Progress Notes (Signed)
Patient discharged to [facility] rehab as ordered, transported via [facility] transporter, reported to Building control surveyor.

## 2021-04-16 ENCOUNTER — Encounter (HOSPITAL_COMMUNITY): Payer: Self-pay | Admitting: Internal Medicine

## 2021-05-07 ENCOUNTER — Ambulatory Visit: Payer: Self-pay | Admitting: Internal Medicine

## 2021-05-07 ENCOUNTER — Encounter: Payer: Self-pay | Admitting: Internal Medicine

## 2021-05-13 ENCOUNTER — Ambulatory Visit: Payer: Medicaid Other | Admitting: Gastroenterology

## 2021-05-28 ENCOUNTER — Ambulatory Visit (HOSPITAL_COMMUNITY): Payer: BLUE CROSS/BLUE SHIELD | Attending: Student | Admitting: Physical Therapy

## 2021-05-28 ENCOUNTER — Other Ambulatory Visit: Payer: Self-pay

## 2021-05-28 DIAGNOSIS — M6281 Muscle weakness (generalized): Secondary | ICD-10-CM | POA: Insufficient documentation

## 2021-05-28 DIAGNOSIS — R262 Difficulty in walking, not elsewhere classified: Secondary | ICD-10-CM

## 2021-05-28 DIAGNOSIS — R29898 Other symptoms and signs involving the musculoskeletal system: Secondary | ICD-10-CM | POA: Insufficient documentation

## 2021-05-28 DIAGNOSIS — M25622 Stiffness of left elbow, not elsewhere classified: Secondary | ICD-10-CM | POA: Insufficient documentation

## 2021-05-28 DIAGNOSIS — M25522 Pain in left elbow: Secondary | ICD-10-CM | POA: Diagnosis present
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
## 2021-06-01 ENCOUNTER — Other Ambulatory Visit: Payer: Self-pay

## 2021-06-01 ENCOUNTER — Encounter (HOSPITAL_COMMUNITY): Payer: Self-pay | Admitting: Physical Therapy

## 2021-06-01 ENCOUNTER — Ambulatory Visit (HOSPITAL_COMMUNITY): Payer: BLUE CROSS/BLUE SHIELD | Admitting: Physical Therapy

## 2021-06-01 DIAGNOSIS — M6281 Muscle weakness (generalized): Secondary | ICD-10-CM

## 2021-06-01 DIAGNOSIS — R262 Difficulty in walking, not elsewhere classified: Secondary | ICD-10-CM
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
## 2021-06-03 ENCOUNTER — Ambulatory Visit (HOSPITAL_COMMUNITY): Payer: BLUE CROSS/BLUE SHIELD | Admitting: Physical Therapy

## 2021-06-03 ENCOUNTER — Other Ambulatory Visit: Payer: Self-pay

## 2021-06-03 ENCOUNTER — Encounter (HOSPITAL_COMMUNITY): Payer: Self-pay | Admitting: Physical Therapy

## 2021-06-03 DIAGNOSIS — R262 Difficulty in walking, not elsewhere classified: Secondary | ICD-10-CM

## 2021-06-03 DIAGNOSIS — M6281 Muscle weakness (generalized): Secondary | ICD-10-CM | POA: Diagnosis not present
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
## 2021-06-08 ENCOUNTER — Encounter (HOSPITAL_COMMUNITY): Payer: Self-pay

## 2021-06-08 ENCOUNTER — Ambulatory Visit (HOSPITAL_COMMUNITY): Payer: BLUE CROSS/BLUE SHIELD

## 2021-06-08 ENCOUNTER — Other Ambulatory Visit: Payer: Self-pay

## 2021-06-08 DIAGNOSIS — M25522 Pain in left elbow: Secondary | ICD-10-CM

## 2021-06-08 DIAGNOSIS — R29898 Other symptoms and signs involving the musculoskeletal system: Secondary | ICD-10-CM

## 2021-06-08 DIAGNOSIS — M6281 Muscle weakness (generalized): Secondary | ICD-10-CM | POA: Diagnosis not present

## 2021-06-08 DIAGNOSIS — M25622 Stiffness of left elbow, not elsewhere classified: Secondary | ICD-10-CM
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
## 2021-06-08 NOTE — Patient Instructions (Signed)
    Elbow extension stretch  Place closed fist on edge of table with thumb up, place opposite hand above elbow to stabilize. With body weight, slowly stretch the elbow to straighten it.  Hold for 20-30 seconds. Complete twice.     1) (Home) Extension: Isometric / Bilateral Arm Retraction - Sitting   Facing anchor, hold hands and elbow at shoulder height, with elbow bent.  Pull arms back to squeeze shoulder blades together. Repeat 10-20 times. 1 times/day.   2) (Clinic) Extension / Flexion (Assist)   Face anchor, pull arms back, keeping elbow straight, and squeze shoulder blades together. Repeat 10-20 times. 1 times/day.   Copyright  VHI. All rights reserved.   3) (Home) Retraction: Row - Bilateral (Anchor)   Facing anchor, arms reaching forward, pull hands toward stomach, keeping elbows bent and at your sides and pinching shoulder blades together. Repeat 10-20 times. 1 times/day.   Copyright  VHI. All rights reserved.

## 2021-06-10 ENCOUNTER — Ambulatory Visit (HOSPITAL_COMMUNITY): Payer: BLUE CROSS/BLUE SHIELD | Admitting: Physical Therapy

## 2021-06-10 ENCOUNTER — Other Ambulatory Visit: Payer: Self-pay

## 2021-06-10 DIAGNOSIS — M6281 Muscle weakness (generalized): Secondary | ICD-10-CM | POA: Diagnosis not present

## 2021-06-10 DIAGNOSIS — R29898 Other symptoms and signs involving the musculoskeletal system: Secondary | ICD-10-CM

## 2021-06-10 DIAGNOSIS — R262 Difficulty in walking, not elsewhere classified: Secondary | ICD-10-CM
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
## 2021-06-11 ENCOUNTER — Encounter (HOSPITAL_COMMUNITY): Payer: Self-pay | Admitting: Physical Therapy

## 2021-06-11 ENCOUNTER — Ambulatory Visit (HOSPITAL_COMMUNITY): Payer: BLUE CROSS/BLUE SHIELD | Admitting: Physical Therapy

## 2021-06-11 DIAGNOSIS — M6281 Muscle weakness (generalized): Secondary | ICD-10-CM

## 2021-06-11 DIAGNOSIS — R29898 Other symptoms and signs involving the musculoskeletal system: Secondary | ICD-10-CM

## 2021-06-11 DIAGNOSIS — R262 Difficulty in walking, not elsewhere classified: Secondary | ICD-10-CM
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
## 2021-06-11 NOTE — Patient Instructions (Signed)
Access Code: [email or web address] URL: [email or web address] Date: 06/11/2021 Prepared by: Mitzi Hansen Dessire Grimes  Exercises Reverse Walking Lunge - 1 x daily - 7 x weekly - 3 sets - 10 reps Side Stepping with Resistance at Ankles - 1 x daily - 7 x weekly - 10 reps Sumo Squat with Dumbbell - 1 x daily - 7 x weekly - 3 sets - 10 reps

## 2021-06-16 ENCOUNTER — Other Ambulatory Visit: Payer: Self-pay

## 2021-06-16 ENCOUNTER — Ambulatory Visit (HOSPITAL_COMMUNITY): Payer: Medicaid Other | Attending: Student | Admitting: Physical Therapy

## 2021-06-16 DIAGNOSIS — M6281 Muscle weakness (generalized): Secondary | ICD-10-CM | POA: Insufficient documentation

## 2021-06-16 DIAGNOSIS — R29898 Other symptoms and signs involving the musculoskeletal system: Secondary | ICD-10-CM | POA: Diagnosis not present
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
## 2021-06-18 ENCOUNTER — Encounter (HOSPITAL_COMMUNITY): Payer: Medicaid Other | Admitting: Physical Therapy

## 2021-06-23 ENCOUNTER — Encounter (HOSPITAL_COMMUNITY): Payer: Medicaid Other

## 2021-06-24 ENCOUNTER — Encounter: Payer: Self-pay | Admitting: Internal Medicine

## 2021-06-25 ENCOUNTER — Encounter (HOSPITAL_COMMUNITY): Payer: Medicaid Other | Admitting: Physical Therapy

## 2021-06-30 ENCOUNTER — Encounter (HOSPITAL_COMMUNITY): Payer: Medicaid Other

## 2021-07-02 ENCOUNTER — Encounter (HOSPITAL_COMMUNITY): Payer: Medicaid Other | Admitting: Physical Therapy

## 2021-07-06 ENCOUNTER — Encounter (HOSPITAL_COMMUNITY): Payer: Medicaid Other | Admitting: Physical Therapy

## 2021-07-08 ENCOUNTER — Encounter (HOSPITAL_COMMUNITY): Payer: Medicaid Other | Admitting: Physical Therapy

## 2021-07-13 ENCOUNTER — Other Ambulatory Visit: Payer: Self-pay | Admitting: Gastroenterology

## 2021-07-13 DIAGNOSIS — K51011 Ulcerative (chronic) pancolitis with rectal bleeding: Secondary | ICD-10-CM

## 2021-07-22 ENCOUNTER — Ambulatory Visit: Payer: Medicaid Other | Admitting: Internal Medicine

## 2021-08-06 ENCOUNTER — Other Ambulatory Visit: Payer: Self-pay

## 2021-08-06 ENCOUNTER — Encounter: Payer: Self-pay | Admitting: Internal Medicine

## 2021-08-06 ENCOUNTER — Ambulatory Visit (INDEPENDENT_AMBULATORY_CARE_PROVIDER_SITE_OTHER): Payer: Medicaid Other | Admitting: Internal Medicine

## 2021-08-06 ENCOUNTER — Ambulatory Visit: Payer: Medicaid Other | Admitting: Internal Medicine

## 2021-08-06 VITALS — BP 140/78 | HR 77 | Temp 97.5°F | Ht 73.0 in | Wt 141.2 lb

## 2021-08-06 DIAGNOSIS — K51011 Ulcerative (chronic) pancolitis with rectal bleeding: Secondary | ICD-10-CM | POA: Diagnosis not present

## 2021-08-06 DIAGNOSIS — Z79620 Long term (current) use of immunosuppressive biologic: Secondary | ICD-10-CM | POA: Diagnosis not present

## 2021-08-06 MED ORDER — BUDESONIDE 3 MG PO CPEP: 9.0000 mg | ORAL_CAPSULE | Freq: Every day | ORAL | 2 refills | Status: AC

## 2021-08-06 NOTE — Patient Instructions (Signed)
We will order blood work today in anticipation of getting you back on biologic therapy.  We will plan to resume Humira as well as methotrexate.  I did refill your budesonide today.  Once we have started the Humira, we will taper off this medication.  We will plan on colonoscopy in 6 to 12 months to evaluate disease activity.  Follow-up with GI in 3 months.  It was nice seeing you again today.  I hope you have a Merry Christmas.  Dr. Abbey Chatters  At [facility] we value your feedback. You may receive a survey about your visit today. Please share your experience as we strive to create trusting relationships with our patients to provide genuine, compassionate, quality care.  We appreciate your understanding and patience as we review any laboratory studies, imaging, and other diagnostic tests that are ordered as we care for you. Our office policy is 5 business days for review of these results, and any emergent or urgent results are addressed in a timely manner for your best interest. If you do not hear from our office in 1 week, please contact us.   We also encourage the use of MyChart, which contains your medical information for your review as well. If you are not enrolled in this feature, an access code is on this after visit summary for your convenience. Thank you for allowing [place] to be involved in your care.  It was great to see you today!  I hope you have a great rest of your Winter!    [place]on Alas. Abbey Chatters, D.O. Gastroenterology and Hepatology [facility]

## 2021-08-06 NOTE — Progress Notes (Signed)
Referring Provider: [facility]* Primary Care Physician:  [facility]:  Dr. Abbey Chatters  Chief Complaint  Patient presents with   Follow-up    Needs budesonide refilled. Has bene out x 3 weeks    HPI:   Jerry Cordova is a 20 y.o. male who presents to clinic today for follow-up visit in regards to ulcerative colitis.  Patient has history of ulcerative pancolitis diagnosed in 2013. Previously followed by pediatric GI at [facility].  He was on Remicade at one point, but he developed antibodies and became symptomatic so he was transitioned to Humira with methotrexate.  Notably, he also had a urticarial rashes with Remicade infusions and this was treated by antihistamines.  Last seen by [facility] in May 2020 and was on Humira 40 mg every other week with methotrexate 20m weekly and folate.  Symptoms were doing well at that time and he was advised to continue his current medication regimen.  Colonoscopy and EGD in 2019: Colonoscopy impression: Moderately active ulcerative colitis; pancolitis with some patulous in the rectum, biopsied.  The examined portion of the ileum was normal s/p biopsy.  TI biopsy was benign, cecal biopsy with quiescent chronic colitis, no active colitis, ascending colon biopsy benign, transverse colon biopsy benign, mild chronic active colitis in the descending and sigmoid colon.  Rectum with mild active colitis, no chronic colitis.   EGD: Normal exam.  Duodenal biopsies with no inflammation or villous blunting.  Gastric biopsies with mild patchy chronic gastritis.  H. pylori negative.  Esophageal biopsies benign.  Patient was seen in the emergency room 11/17/2020 for abdominal pain.  Reported he has not been on any medications for UC in over a year, but felt the symptoms were similar to flares.  CBC remarkable for WBC 17.7.  BMP within normal limits.  He was prescribed Zofran and prednisone 20 mg twice daily x5 days then 1 daily for 5 days.  Stool studies negative for infection.  Hepatitis B and varicella vaccines completed 02/24/2021.  Today he states he is doing well.  Notes normal bowel movements 1-3 daily.  No rectal bleeding or mucus in his stool.  Very slight abdominal pain on occasion.  Continues to take budesonide and is requesting refills today.  Past Medical History:  Diagnosis Date   Anxiety    Is in therapy, not on medication   Chest wall injury 2010   Hit by baseball, radiography without fracture, discharged home   [place]ronic ulcerative colitis ([place]    Ulcerative colitis ([place]     Past Surgical History:  Procedure Laterality Date   BALLOON DILATION N/A 01/09/2021   Procedure: BALLOON DILATION;  Surgeon: CEloise Harman DO;  [facility];  Service: Endoscopy;  Laterality: N/A;   BIOPSY  01/09/2021   Procedure: BIOPSY;  Surgeon: CEloise Harman DO;  [facility];  Service: Endoscopy;;  GASTRIC, MID-ESOPHAGUS   COLONOSCOPY  12/02/2011    Surgeon: JOletha Blend MD; entire colonic mucosa with marked friability, granularity, edema, and erythema with no skip lesions or rectal sparing.  Unable to negotiate through the ascending colon to directly visualize the cecum.  Multiple biopsies were taken and all revealed chronic active colitis consistent with IBD.   COLONOSCOPY  2019   [facility]- Moderately active UC, pancolitis with some patulous in the rectum.  Pathology: TI biopsy benign, cecal biopsy with quiescent chronic colitis, no active colitis, ascending and transverse colon biopsies benign, mild  chronic active colitis in the descending and sigmoid colon, mild active colitis in the rectum.   ESOPHAGEAL BRUSHING  01/09/2021   Procedure: ESOPHAGEAL BRUSHING;  Surgeon: Eloise Harman, DO;  [facility];  Service: Endoscopy;;   ESOPHAGOGASTRODUODENOSCOPY  2019   [facility]; Normal exam.  Duodenal biopsies with no inflammation or villous blunting.  Gastric biopsies with mild patchy chronic  gastritis.  H. pylori negative.  Esophageal biopsies benign.   ESOPHAGOGASTRODUODENOSCOPY (EGD) WITH PROPOFOL N/A 01/09/2021   Procedure: ESOPHAGOGASTRODUODENOSCOPY (EGD) WITH PROPOFOL;  Surgeon: Eloise Harman, DO;  [facility];  Service: Endoscopy;  Laterality: N/A;  2:45pm   FEMUR IM NAIL Left 04/05/2021   Procedure: INTRAMEDULLARY (IM) RETROGRADE FEMORAL NAILING;  Surgeon: Hiram Gash, MD;  Location: [place];  Service: Orthopedics;  Laterality: Left;   I & D EXTREMITY Left 04/05/2021   Procedure: IRRIGATION AND DEBRIDEMENT LEFT LEG;  Surgeon: Hiram Gash, MD;  Location: [place];  Service: Orthopedics;  Laterality: Left;   ORIF HUMERUS FRACTURE Left 04/06/2021   Procedure: OPEN REDUCTION INTERNAL FIXATION (ORIF) DISTAL HUMERUS FRACTURE;  Surgeon: Shona Needles, MD;  Location: [place];  Service: Orthopedics;  Laterality: Left;   ORIF PELVIC FRACTURE N/A 04/06/2021   Procedure: OPEN REDUCTION INTERNAL FIXATION (ORIF) PELVIC FRACTURE;  Surgeon: Shona Needles, MD;  Location: [place];  Service: Orthopedics;  Laterality: N/A;    Current Outpatient Medications  Medication Sig Dispense Refill   budesonide (ENTOCORT EC) 3 MG 24 hr capsule Take 3 capsules (9 mg total) by mouth daily. 90 capsule 2   No current facility-administered medications for this visit.    Allergies as of 08/06/2021 - Review Complete 08/06/2021  Allergen Reaction Noted   Penicillins Hives and Rash 11/28/2011   Penicillins Hives 04/05/2021   Remicade [infliximab] Hives 12/22/2020    Family History  Problem Relation Age of Onset   Diabetes Other        Maternal family   Asthma Other        maternal family   Cancer Other        maternal family   Heart disease Other        Maternal GGF later in life    Ulcerative colitis Other        Unrelated step maternal GF    GI problems Cousin        Mother began asking paternal family about GI problems and learned of 2 young cousins with "bowel problems".    Colon  cancer Neg Hx     Social History   Socioeconomic History   Marital status: Single    Spouse name: Not on file   Number of children: Not on file   Years of education: Not on file   Highest education level: Not on file  Occupational History   Not on file  Tobacco Use   Smoking status: Some Days    Types: E-cigarettes   Smokeless tobacco: Current    Types: Chew   Tobacco comments:    Chew with vape  Vaping Use   Vaping Use: Every day  Substance and Sexual Activity   Alcohol use: Yes   Drug use: Not Currently   Sexual activity: Never  Other Topics Concern   Not on file  Social History Narrative   ** Merged History Encounter **       Social Determinants of Health   Financial Resource Strain: Not on file  Food Insecurity: Not on file  Transportation Needs: Not on file  Physical Activity: Not on file  Stress: Not on file  Social Connections: Not on file    Subjective: Review of Systems  Constitutional:  Negative for chills and fever.  HENT:  Negative for congestion and hearing loss.   Eyes:  Negative for blurred vision and double vision.  Respiratory:  Negative for cough and shortness of breath.   Cardiovascular:  Negative for chest pain and palpitations.  Gastrointestinal:  Negative for abdominal pain, blood in stool, constipation, diarrhea, heartburn, melena and vomiting.  Genitourinary:  Negative for dysuria and urgency.  Musculoskeletal:  Negative for joint pain and myalgias.  Skin:  Negative for itching and rash.  Neurological:  Negative for dizziness and headaches.  Psychiatric/Behavioral:  Negative for depression. The patient is not nervous/anxious.     Objective: BP 140/78    Pulse 77    Temp (!) 97.5 F (36.4 C)    Ht _0  (1.854 m)    Wt 141 lb 3.2 oz (64 kg)    BMI 18.63 kg/m  Physical Exam Constitutional:      Appearance: Normal appearance.  HENT:     Head: Normocephalic and atraumatic.  Eyes:     Extraocular Movements: Extraocular movements  intact.     Conjunctiva/sclera: Conjunctivae normal.  Cardiovascular:     Rate and Rhythm: Normal rate and regular rhythm.  Pulmonary:     Effort: Pulmonary effort is normal.     Breath sounds: Normal breath sounds.  Abdominal:     General: Bowel sounds are normal.     Palpations: Abdomen is soft.  Musculoskeletal:        General: Normal range of motion.     Cervical back: Normal range of motion and neck supple.  Skin:    General: Skin is warm.  Neurological:     General: No focal deficit present.     Mental Status: He is alert and oriented to person, place, and time.  Psychiatric:        Mood and Affect: Mood normal.        Behavior: Behavior normal.     Assessment: *Ulcerative pancolitis  Plan: Discussed ulcerative colitis in depth with patient today.  He missed follow-up appointment with me in August.  Symptoms currently well controlled on budesonide.  I counseled him that this is a temporary medication and that we need to take steps to get him back on maintenance medication with biologic therapy.  Discussed options with him in depth today.  He would like to pursue Humira and methotrexate which he was on previously as his symptoms were well controlled.    We will check CBC, CMP, hepatitis B and C, QuantiFERON, ESR today.  Once I have these labs back, we will start the process of getting Humira approved.  We will plan on colonoscopy to evaluate disease activity in 6 months.  Budesonide refilled today.  We will taper off this medication once he starts his Humira and methotrexate.  Follow-up in 3 months.  08/06/2021 1:46 PM   Disclaimer: This note was dictated with voice recognition software. Similar sounding words can inadvertently be transcribed and may not be corrected upon review.

## 2021-09-30 ENCOUNTER — Ambulatory Visit: Payer: Self-pay | Admitting: Student
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
## 2021-10-14 ENCOUNTER — Other Ambulatory Visit: Payer: Self-pay

## 2021-10-14 ENCOUNTER — Encounter (HOSPITAL_COMMUNITY): Payer: Self-pay | Admitting: Student

## 2021-10-14 NOTE — Progress Notes (Signed)
PCP - [place] ? ?Cardiologist - Denies ? ?EP- Denies ? ?Endocrine- Denies ? ?Pulm- Denies ? ?Chest x-ray - 04/06/21 (E) ? ?EKG - 11/30/20 (E) ? ?Stress Test - Denies ? ?ECHO - Denies ? ?Cardiac Cath - Denies ? ?AICD- na ?PM- na ?LOOP- na ? ?Nerve Stimulator- Denies ? ?Dialysis- Denies ? ?Sleep Study - Denies ?CPAP - Denies ? ?LABS- 10/16/21: CBC ? ?ASA- Denies ? ?ERAS- Yes- clears until 0430 ? ?HA1C- Denies ? ?Anesthesia- No ? ?Pt denies having chest pain, sob, or fever during the pre-op phone call. All instructions explained to the pt, with a verbal understanding of the material including: as of today, stop taking all Aspirin (unless instructed by your doctor) and Other Aspirin containing products, Vitamins, Fish oils, and Herbal medications. Also stop all NSAIDS i.e. Advil, Ibuprofen, Motrin, Aleve, Anaprox, Naproxen, BC, Goody Powders, and all Supplements.  Pt also instructed to wear a mask and social distance if he goes out. The opportunity to ask questions was provided.  ? ? ?Coronavirus Screening ? ?Have you experienced the following symptoms:  ?Cough yes/no: No ?Fever (>100.22F)  yes/no: No ?Runny nose yes/no: No ?Sore throat yes/no: No ?Difficulty breathing/shortness of breath  yes/no: No ? ?Have you or a family member traveled in the last 14 days and where? yes/no: No ? ? ?If the patient indicates "YES" to the above questions, their PAT will be rescheduled to limit the exposure to others and, the surgeon will be notified. THE PATIENT WILL NEED TO BE ASYMPTOMATIC FOR 14 DAYS.  ? ?If the patient is not experiencing any of these symptoms, the PAT nurse will instruct them to NOT bring anyone with them to their appointment since they may have these symptoms or traveled as well.  ? ?Please remind your patients and families that hospital visitation restrictions are in effect and the importance of the restrictions.  ? ?

## 2021-10-16 ENCOUNTER — Other Ambulatory Visit: Payer: Self-pay

## 2021-10-16 ENCOUNTER — Encounter (HOSPITAL_COMMUNITY): Payer: Self-pay | Admitting: Student

## 2021-10-16 ENCOUNTER — Ambulatory Visit (HOSPITAL_COMMUNITY): Payer: Medicaid Other

## 2021-10-16 ENCOUNTER — Ambulatory Visit (HOSPITAL_COMMUNITY)
Admission: RE | Admit: 2021-10-16 | Discharge: 2021-10-16 | Disposition: A | Discharge status: Home / Self Care | Payer: Medicaid Other | Attending: Student | Admitting: Student

## 2021-10-16 ENCOUNTER — Ambulatory Visit (HOSPITAL_COMMUNITY): Payer: Medicaid Other | Admitting: Anesthesiology

## 2021-10-16 ENCOUNTER — Encounter (HOSPITAL_COMMUNITY)
Admission: RE | Disposition: A | Discharge status: Home / Self Care | Payer: Self-pay | Source: Home / Self Care | Attending: Student

## 2021-10-16 ENCOUNTER — Ambulatory Visit (HOSPITAL_BASED_OUTPATIENT_CLINIC_OR_DEPARTMENT_OTHER): Payer: Medicaid Other | Admitting: Anesthesiology

## 2021-10-16 DIAGNOSIS — T8484XA Pain due to internal orthopedic prosthetic devices, implants and grafts, initial encounter: Secondary | ICD-10-CM

## 2021-10-16 DIAGNOSIS — F419 Anxiety disorder, unspecified: Secondary | ICD-10-CM | POA: Diagnosis not present

## 2021-10-16 DIAGNOSIS — Y831 Surgical operation with implant of artificial internal device as the cause of abnormal reaction of the patient, or of later complication, without mention of misadventure at the time of the procedure: Secondary | ICD-10-CM | POA: Diagnosis not present

## 2021-10-16 DIAGNOSIS — Z87891 Personal history of nicotine dependence: Secondary | ICD-10-CM | POA: Diagnosis not present

## 2021-10-16 DIAGNOSIS — Z419 Encounter for procedure for purposes other than remedying health state, unspecified: Secondary | ICD-10-CM

## 2021-10-16 HISTORY — DX: Other complications of anesthesia, initial encounter: T88.59XA

## 2021-10-16 HISTORY — PX: HARDWARE REMOVAL: SHX979

## 2021-10-16 LAB — CBC
HCT: 47.2 % (ref 39.0–52.0)
Hemoglobin: 16 g/dL (ref 13.0–17.0)
MCH: 30.9 pg (ref 26.0–34.0)
MCHC: 33.9 g/dL (ref 30.0–36.0)
MCV: 91.3 fL (ref 80.0–100.0)
Platelets: 384 10*3/uL (ref 150–400)
RBC: 5.17 MIL/uL (ref 4.22–5.81)
RDW: 14.2 % (ref 11.5–15.5)
WBC: 11 10*3/uL — ABNORMAL HIGH (ref 4.0–10.5)
nRBC: 0 % (ref 0.0–0.2)

## 2021-10-16 SURGERY — REMOVAL, HARDWARE
Anesthesia: General | Site: Thigh | Laterality: Left

## 2021-10-16 MED ORDER — FENTANYL CITRATE (PF) 250 MCG/5ML IJ SOLN
INTRAMUSCULAR | Status: AC
  Filled 2021-10-16: qty 5

## 2021-10-16 MED ORDER — ORAL CARE MOUTH RINSE: 15.0000 mL | Freq: Once | OROMUCOSAL | Status: AC

## 2021-10-16 MED ORDER — ACETAMINOPHEN 10 MG/ML IV SOLN
INTRAVENOUS | Status: DC | PRN
  Administered 2021-10-16: 1000 mg via INTRAVENOUS

## 2021-10-16 MED ORDER — LACTATED RINGERS IV SOLN: INTRAVENOUS | Status: DC

## 2021-10-16 MED ORDER — KETOROLAC TROMETHAMINE 30 MG/ML IJ SOLN
INTRAMUSCULAR | Status: DC | PRN
  Administered 2021-10-16: 30 mg via INTRAVENOUS

## 2021-10-16 MED ORDER — ROCURONIUM BROMIDE 10 MG/ML (PF) SYRINGE
PREFILLED_SYRINGE | INTRAVENOUS | Status: DC | PRN
  Administered 2021-10-16: 60 mg via INTRAVENOUS

## 2021-10-16 MED ORDER — CHLORHEXIDINE GLUCONATE 0.12 % MT SOLN: 15.0000 mL | Freq: Once | OROMUCOSAL | Status: AC

## 2021-10-16 MED ORDER — MIDAZOLAM HCL 2 MG/2ML IJ SOLN
INTRAMUSCULAR | Status: AC
  Filled 2021-10-16: qty 2

## 2021-10-16 MED ORDER — PHENYLEPHRINE 40 MCG/ML (10ML) SYRINGE FOR IV PUSH (FOR BLOOD PRESSURE SUPPORT)
PREFILLED_SYRINGE | INTRAVENOUS | Status: AC
  Filled 2021-10-16: qty 10

## 2021-10-16 MED ORDER — KETOROLAC TROMETHAMINE 30 MG/ML IJ SOLN
INTRAMUSCULAR | Status: AC
  Filled 2021-10-16: qty 1

## 2021-10-16 MED ORDER — ROCURONIUM BROMIDE 10 MG/ML (PF) SYRINGE
PREFILLED_SYRINGE | INTRAVENOUS | Status: AC
  Filled 2021-10-16: qty 10

## 2021-10-16 MED ORDER — MIDAZOLAM HCL 2 MG/2ML IJ SOLN
INTRAMUSCULAR | Status: DC | PRN
  Administered 2021-10-16: 2 mg via INTRAVENOUS

## 2021-10-16 MED ORDER — SUGAMMADEX SODIUM 200 MG/2ML IV SOLN
INTRAVENOUS | Status: DC | PRN
  Administered 2021-10-16: 400 mg via INTRAVENOUS

## 2021-10-16 MED ORDER — CHLORHEXIDINE GLUCONATE 0.12 % MT SOLN
OROMUCOSAL | Status: AC
  Administered 2021-10-16: 15 mL via OROMUCOSAL
  Filled 2021-10-16: qty 15

## 2021-10-16 MED ORDER — ACETAMINOPHEN 10 MG/ML IV SOLN
INTRAVENOUS | Status: AC
  Filled 2021-10-16: qty 100

## 2021-10-16 MED ORDER — PROPOFOL 10 MG/ML IV BOLUS
INTRAVENOUS | Status: DC | PRN
  Administered 2021-10-16: 200 mg via INTRAVENOUS

## 2021-10-16 MED ORDER — DEXAMETHASONE SODIUM PHOSPHATE 10 MG/ML IJ SOLN
INTRAMUSCULAR | Status: DC | PRN
  Administered 2021-10-16: 10 mg via INTRAVENOUS

## 2021-10-16 MED ORDER — PROPOFOL 10 MG/ML IV BOLUS
INTRAVENOUS | Status: AC
  Filled 2021-10-16: qty 20

## 2021-10-16 MED ORDER — CEFAZOLIN SODIUM-DEXTROSE 2-4 GM/100ML-% IV SOLN
2.0000 g | INTRAVENOUS | Status: AC
  Administered 2021-10-16: 2 g via INTRAVENOUS
  Filled 2021-10-16: qty 100

## 2021-10-16 MED ORDER — 0.9 % SODIUM CHLORIDE (POUR BTL) OPTIME
TOPICAL | Status: DC | PRN
  Administered 2021-10-16: 1000 mL

## 2021-10-16 MED ORDER — LIDOCAINE 2% (20 MG/ML) 5 ML SYRINGE
INTRAMUSCULAR | Status: DC | PRN
  Administered 2021-10-16: 40 mg via INTRAVENOUS

## 2021-10-16 MED ORDER — DEXAMETHASONE SODIUM PHOSPHATE 10 MG/ML IJ SOLN
INTRAMUSCULAR | Status: AC
  Filled 2021-10-16: qty 1

## 2021-10-16 MED ORDER — ONDANSETRON HCL 4 MG/2ML IJ SOLN
INTRAMUSCULAR | Status: AC
  Filled 2021-10-16: qty 2

## 2021-10-16 MED ORDER — FENTANYL CITRATE (PF) 250 MCG/5ML IJ SOLN
INTRAMUSCULAR | Status: DC | PRN
  Administered 2021-10-16 (×3): 50 ug via INTRAVENOUS

## 2021-10-16 MED ORDER — HYDROCODONE-ACETAMINOPHEN 5-325 MG PO TABS: 1.0000 | ORAL_TABLET | Freq: Four times a day (QID) | ORAL | 0 refills | Status: DC | PRN

## 2021-10-16 MED ORDER — ONDANSETRON HCL 4 MG/2ML IJ SOLN
INTRAMUSCULAR | Status: DC | PRN
  Administered 2021-10-16: 4 mg via INTRAVENOUS

## 2021-10-16 MED ORDER — LIDOCAINE 2% (20 MG/ML) 5 ML SYRINGE
INTRAMUSCULAR | Status: AC
  Filled 2021-10-16: qty 5

## 2021-10-16 SURGICAL SUPPLY — 60 items
BAG COUNTER SPONGE SURGICOUNT (BAG) ×2 IMPLANT
BANDAGE ESMARK 6X9 LF (GAUZE/BANDAGES/DRESSINGS) ×1 IMPLANT
BNDG COHESIVE 6X5 TAN STRL LF (GAUZE/BANDAGES/DRESSINGS) ×2 IMPLANT
BNDG ELASTIC 4X5.8 VLCR STR LF (GAUZE/BANDAGES/DRESSINGS) ×2 IMPLANT
BNDG ELASTIC 6X5.8 VLCR STR LF (GAUZE/BANDAGES/DRESSINGS) ×2 IMPLANT
BNDG ESMARK 6X9 LF (GAUZE/BANDAGES/DRESSINGS) ×2
BNDG GAUZE ELAST 4 BULKY (GAUZE/BANDAGES/DRESSINGS) ×4 IMPLANT
BRUSH SCRUB EZ PLAIN DRY (MISCELLANEOUS) ×4 IMPLANT
CHLORAPREP W/TINT 26 (MISCELLANEOUS) ×2 IMPLANT
COVER SURGICAL LIGHT HANDLE (MISCELLANEOUS) ×4 IMPLANT
CUFF TOURN SGL QUICK 18X4 (TOURNIQUET CUFF) IMPLANT
CUFF TOURN SGL QUICK 24 (TOURNIQUET CUFF)
CUFF TOURN SGL QUICK 34 (TOURNIQUET CUFF)
CUFF TRNQT CYL 24X4X16.5-23 (TOURNIQUET CUFF) IMPLANT
CUFF TRNQT CYL 34X4.125X (TOURNIQUET CUFF) IMPLANT
DRAPE C-ARM 42X72 X-RAY (DRAPES) IMPLANT
DRAPE C-ARMOR (DRAPES) ×1 IMPLANT
DRAPE IMP U-DRAPE 54X76 (DRAPES) ×1 IMPLANT
DRAPE U-SHAPE 47X51 STRL (DRAPES) ×2 IMPLANT
DRESSING MEPILEX FLEX 4X4 (GAUZE/BANDAGES/DRESSINGS) IMPLANT
DRSG ADAPTIC 3X8 NADH LF (GAUZE/BANDAGES/DRESSINGS) ×2 IMPLANT
DRSG MEPILEX FLEX 4X4 (GAUZE/BANDAGES/DRESSINGS) ×2
ELECT REM PT RETURN 9FT ADLT (ELECTROSURGICAL) ×2
ELECTRODE REM PT RTRN 9FT ADLT (ELECTROSURGICAL) ×1 IMPLANT
GAUZE SPONGE 4X4 12PLY STRL (GAUZE/BANDAGES/DRESSINGS) ×2 IMPLANT
GLOVE SURG ENC MOIS LTX SZ6.5 (GLOVE) ×6 IMPLANT
GLOVE SURG ENC MOIS LTX SZ7.5 (GLOVE) ×8 IMPLANT
GLOVE SURG UNDER POLY LF SZ6.5 (GLOVE) ×2 IMPLANT
GLOVE SURG UNDER POLY LF SZ7.5 (GLOVE) ×2 IMPLANT
GOWN STRL REUS W/ TWL LRG LVL3 (GOWN DISPOSABLE) ×2 IMPLANT
GOWN STRL REUS W/TWL LRG LVL3 (GOWN DISPOSABLE) ×4
KIT BASIN OR (CUSTOM PROCEDURE TRAY) ×2 IMPLANT
KIT TURNOVER KIT B (KITS) ×2 IMPLANT
MANIFOLD NEPTUNE II (INSTRUMENTS) ×2 IMPLANT
NEEDLE 22X1 1/2 (OR ONLY) (NEEDLE) IMPLANT
NS IRRIG 1000ML POUR BTL (IV SOLUTION) ×2 IMPLANT
PACK ORTHO EXTREMITY (CUSTOM PROCEDURE TRAY) ×2 IMPLANT
PAD ARMBOARD 7.5X6 YLW CONV (MISCELLANEOUS) ×4 IMPLANT
PADDING CAST COTTON 6X4 STRL (CAST SUPPLIES) ×6 IMPLANT
SPONGE T-LAP 18X18 ~~LOC~~+RFID (SPONGE) ×2 IMPLANT
STAPLER VISISTAT 35W (STAPLE) IMPLANT
STOCKINETTE IMPERVIOUS LG (DRAPES) ×2 IMPLANT
STRIP CLOSURE SKIN 1/2X4 (GAUZE/BANDAGES/DRESSINGS) ×1 IMPLANT
SUCTION FRAZIER HANDLE 10FR (MISCELLANEOUS)
SUCTION TUBE FRAZIER 10FR DISP (MISCELLANEOUS) IMPLANT
SUT ETHILON 3 0 PS 1 (SUTURE) IMPLANT
SUT MNCRL AB 3-0 PS2 18 (SUTURE) ×2 IMPLANT
SUT MON AB 2-0 CT1 36 (SUTURE) ×2 IMPLANT
SUT PDS AB 2-0 CT1 27 (SUTURE) IMPLANT
SUT VIC AB 0 CT1 27 (SUTURE)
SUT VIC AB 0 CT1 27XBRD ANBCTR (SUTURE) IMPLANT
SUT VIC AB 2-0 CT1 27 (SUTURE)
SUT VIC AB 2-0 CT1 TAPERPNT 27 (SUTURE) IMPLANT
SYR CONTROL 10ML LL (SYRINGE) IMPLANT
TOWEL GREEN STERILE (TOWEL DISPOSABLE) ×4 IMPLANT
TOWEL GREEN STERILE FF (TOWEL DISPOSABLE) ×4 IMPLANT
TUBE CONNECTING 12X1/4 (SUCTIONS) ×2 IMPLANT
UNDERPAD 30X36 HEAVY ABSORB (UNDERPADS AND DIAPERS) ×2 IMPLANT
WATER STERILE IRR 1000ML POUR (IV SOLUTION) ×4 IMPLANT
YANKAUER SUCT BULB TIP NO VENT (SUCTIONS) ×2 IMPLANT

## 2021-10-16 NOTE — Discharge Instructions (Addendum)
? ?[facility] Discharge Instructions ? ? ?General Discharge Instructions ? ?WEIGHT BEARING STATUS: Weightbearing as tolerated ? ?RANGE OF MOTION/ACTIVITY: No range of motion restrictions ? ?Wound Care: You may remove your surgical dressing on post-op day #2 (Sunday 10/18/21). Incisions can be left open to air if there is no drainage. If incision continues to have drainage, follow wound care instructions below. Okay to shower if no drainage from incisions. ? ?DVT/PE prophylaxis: None ? ?Diet: as you were eating previously.  Can use over the counter stool softeners and bowel preparations, such as Miralax, to help with bowel movements.  Narcotics can be constipating.  Be sure to drink plenty of fluids ? ?PAIN MEDICATION USE AND EXPECTATIONS ? You have likely been given narcotic medications to help control your pain.  After a traumatic event that results in an fracture (broken bone) with or without surgery, it is ok to use narcotic pain medications to help control one's pain.  We understand that everyone responds to pain differently and each individual patient will be evaluated on a regular basis for the continued need for narcotic medications. Ideally, narcotic medication use should last no more than 6-8 weeks (coinciding with fracture healing).  ? As a patient it is your responsibility as well to monitor narcotic medication use and report the amount and frequency you use these medications when you come to your office visit.  ? We would also advise that if you are using narcotic medications, you should take a dose prior to therapy to maximize you participation. ? ?IF YOU ARE ON NARCOTIC MEDICATIONS IT IS NOT PERMISSIBLE TO OPERATE A MOTOR VEHICLE (MOTORCYCLE/CAR/TRUCK/MOPED) OR HEAVY MACHINERY ?DO NOT MIX NARCOTICS WITH OTHER CNS ([place]) DEPRESSANTS SUCH AS ALCOHOL ? ? ?STOP SMOKING OR USING NICOTINE PRODUCTS!!!! ? As discussed nicotine severely impairs your body's ability to heal  surgical and traumatic wounds but also impairs bone healing.  Wounds and bone heal by forming microscopic blood vessels (angiogenesis) and nicotine is a vasoconstrictor (essentially, shrinks blood vessels).  Therefore, if vasoconstriction occurs to these microscopic blood vessels they essentially disappear and are unable to deliver necessary nutrients to the healing tissue.  This is one modifiable factor that you can do to dramatically increase your chances of healing your injury.   ? (This means no smoking, no nicotine gum, patches, etc) ? ?DO NOT USE NONSTEROIDAL ANTI-INFLAMMATORY DRUGS (NSAID'S) ? Using products such as Advil (ibuprofen), Aleve (naproxen), Motrin (ibuprofen) for additional pain control during fracture healing can delay and/or prevent the healing response.  If you would like to take over the counter (OTC) medication, Tylenol (acetaminophen) is ok.  However, some narcotic medications that are given for pain control contain acetaminophen as well. Therefore, you should not exceed more than 4000 mg of tylenol in a day if you do not have liver disease.  Also note that there are may OTC medicines, such as cold medicines and allergy medicines that my contain tylenol as well.  If you have any questions about medications and/or interactions please ask your doctor/PA or your pharmacist.  ?   ? ?ICE AND ELEVATE INJURED/OPERATIVE EXTREMITY ? Using ice and elevating the injured extremity above your heart can help with swelling and pain control.  Icing in a pulsatile fashion, such as 20 minutes on and 20 minutes off, can be followed.   ? Do not place ice directly on skin. Make sure there is a barrier between to skin and the ice pack.   ? Using frozen  items such as frozen peas works well as the conform nicely to the are that needs to be iced. ? ?USE AN ACE WRAP OR TED HOSE FOR SWELLING CONTROL ? In addition to icing and elevation, Ace wraps or TED hose are used to help limit and resolve swelling.  It is  recommended to use Ace wraps or TED hose until you are informed to stop.   ? When using Ace Wraps start the wrapping distally (farthest away from the body) and wrap proximally (closer to the body) ?  Example: If you had surgery on your leg or thing and you do not have a splint on, start the ace wrap at the toes and work your way up to the thigh ?       If you had surgery on your upper extremity and do not have a splint on, start the ace wrap at your fingers and work your way up to the upper arm ? ? ?CALL THE OFFICE WITH ANY QUESTIONS OR CONCERNS: [phone]  ? ?VISIT OUR WEBSITE FOR ADDITIONAL INFORMATION: [email or web address] ?  ? ?Discharge Wound Care Instructions ? ?Do NOT apply any ointments, solutions or lotions to pin sites or surgical wounds.  These prevent needed drainage and even though solutions like hydrogen peroxide kill bacteria, they also damage cells lining the pin sites that help fight infection.  Applying lotions or ointments can keep the wounds moist and can cause them to breakdown and open up as well. This can increase the risk for infection. When in doubt call the office. ? ?If any drainage is noted, use foam dressing ? ?Once the incision is completely dry and without drainage, it may be left open to air out.  Showering may begin 36-48 hours later.  Cleaning gently with soap and water. ?

## 2021-10-16 NOTE — Transfer of Care (Signed)
Immediate Anesthesia Transfer of Care Note ? ?Patient: Jerry Cordova ? ?Procedure(s) Performed: HARDWARE REMOVAL DISTAL FEMUR (Left: Thigh) ? ?Patient Location: PACU ? ?Anesthesia Type:General ? ?Level of Consciousness: awake, alert  and oriented ? ?Airway & Oxygen Therapy: Patient Spontanous Breathing and Patient connected to nasal cannula oxygen ? ?Post-op Assessment: Report given to RN and Post -op Vital signs reviewed and stable ? ?Post vital signs: Reviewed and stable ? ?Last Vitals:  ?Vitals Value Taken Time  ?BP 120/66 10/16/21 0832  ?Temp    ?Pulse 61 10/16/21 0834  ?Resp 12 10/16/21 0834  ?SpO2 98 % 10/16/21 0834  ?Vitals shown include unvalidated device data. ? ?Last Pain:  ?Vitals:  ? 10/16/21 0624  ?TempSrc:   ?PainSc: 0-No pain  ?   ? ?Patients Stated Pain Goal: 1 (10/14/21 1831) ? ?Complications: No notable events documented. ?

## 2021-10-16 NOTE — Anesthesia Procedure Notes (Signed)
Procedure Name: Intubation ?Date/Time: 10/16/2021 7:43 AM ?Performed by: Inda Coke, CRNA ?Pre-anesthesia Checklist: Patient identified, Emergency Drugs available, Suction available and Patient being monitored ?Patient Re-evaluated:Patient Re-evaluated prior to induction ?Oxygen Delivery Method: Circle System Utilized ?Preoxygenation: Pre-oxygenation with 100% oxygen ?Induction Type: IV induction ?Ventilation: Mask ventilation without difficulty ?Laryngoscope Size: Mac and 4 ?Grade View: Grade I ?Tube type: Oral ?Tube size: 7.5 mm ?Number of attempts: 1 ?Airway Equipment and Method: Stylet and Oral airway ?Placement Confirmation: ETT inserted through vocal cords under direct vision, positive ETCO2 and breath sounds checked- equal and bilateral ?Secured at: 23 cm ?Tube secured with: Tape ?Dental Injury: Teeth and Oropharynx as per pre-operative assessment  ? ? ? ? ?

## 2021-10-16 NOTE — Interval H&P Note (Signed)
History and Physical Interval Note: ? ?10/16/2021 ?7:24 AM ? ?Jerry Cordova  has presented today for surgery, with the diagnosis of Painful orthopaedic hardware.  The various methods of treatment have been discussed with the patient and family. After consideration of risks, benefits and other options for treatment, the patient has consented to  Procedure(s): ?[place] (Left) as a surgical intervention.  The patient's history has been reviewed, patient examined, no change in status, stable for surgery.  I have reviewed the patient's chart and labs.  Questions were answered to the patient's satisfaction.   ? ? ?[name] ? ? ?

## 2021-10-16 NOTE — Anesthesia Preprocedure Evaluation (Addendum)
Anesthesia Evaluation  ?Patient identified by MRN, date of birth, ID band ?Patient awake ? ? ? ?Reviewed: ?Allergy & Precautions, NPO status , Patient's Chart, lab work & pertinent test results ? ?Airway ?Mallampati: I ? ?TM Distance: >3 FB ?Neck ROM: Full ? ? ? Dental ? ?(+) Teeth Intact, Dental Advisory Given ?  ?Pulmonary ?former smoker,  ?  ?breath sounds clear to auscultation ? ? ? ? ? ? Cardiovascular ?negative cardio ROS ? ? ?Rhythm:Regular Rate:Normal ? ? ?  ?Neuro/Psych ?PSYCHIATRIC DISORDERS Anxiety   ? GI/Hepatic ?Neg liver ROS, PUD,   ?Endo/Other  ?negative endocrine ROS ? Renal/GU ?negative Renal ROS  ? ?  ?Musculoskeletal ?negative musculoskeletal ROS ?(+)  ? Abdominal ?Normal abdominal exam  (+)   ?Peds ? Hematology ?negative hematology ROS ?(+)   ?Anesthesia Other Findings ? ? Reproductive/Obstetrics ? ?  ? ? ? ? ? ? ? ? ? ? ? ? ? ?  ?  ? ? ? ? ? ? ? ?Anesthesia Physical ?Anesthesia Plan ? ?ASA: 2 ? ?Anesthesia Plan: General  ? ?Post-op Pain Management: Ofirmev IV (intra-op)* and Toradol IV (intra-op)*  ? ?Induction: Intravenous ? ?PONV Risk Score and Plan: 3 and Ondansetron, Dexamethasone and Midazolam ? ?Airway Management Planned: Oral ETT ? ?Additional Equipment: None ? ?Intra-op Plan:  ? ?Post-operative Plan: Extubation in OR ? ?Informed Consent: I have reviewed the patients History and Physical, chart, labs and discussed the procedure including the risks, benefits and alternatives for the proposed anesthesia with the patient or authorized representative who has indicated his/her understanding and acceptance.  ? ? ? ?Dental advisory given ? ?Plan Discussed with: CRNA ? ?Anesthesia Plan Comments:   ? ? ? ? ? ?Anesthesia Quick Evaluation ? ?
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
## 2021-10-16 NOTE — Anesthesia Postprocedure Evaluation (Signed)
Anesthesia Post Note ? ?Patient: Jerry Cordova ? ?Procedure(s) Performed: HARDWARE REMOVAL DISTAL FEMUR (Left: Thigh) ? ?  ? ?Patient location during evaluation: PACU ?Anesthesia Type: General ?Level of consciousness: awake and alert ?Pain management: pain level controlled ?Vital Signs Assessment: post-procedure vital signs reviewed and stable ?Respiratory status: spontaneous breathing, nonlabored ventilation, respiratory function stable and patient connected to nasal cannula oxygen ?Cardiovascular status: blood pressure returned to baseline and stable ?Postop Assessment: no apparent nausea or vomiting ?Anesthetic complications: no ? ? ?No notable events documented. ? ?Last Vitals:  ?Vitals:  ? 10/16/21 0847 10/16/21 0855  ?BP: 118/61 132/70  ?Pulse: 62 67  ?Resp: 12 18  ?Temp:  (!) 36.2 ?C  ?SpO2: 100% 100%  ?  ?Last Pain:  ?Vitals:  ? 10/16/21 0855  ?TempSrc:   ?PainSc: 3   ? ? ?  ?  ?  ?  ?  ?  ? ?Effie Berkshire ? ? ? ? ?

## 2021-10-19 ENCOUNTER — Encounter (HOSPITAL_COMMUNITY): Payer: Self-pay | Admitting: Student

## 2021-10-29 ENCOUNTER — Encounter: Payer: Self-pay | Admitting: Internal Medicine

## 2021-12-16 ENCOUNTER — Telehealth: Payer: Self-pay

## 2021-12-16 NOTE — Telephone Encounter (Signed)
Dr. Abbey Chatters, ?Pt LMOVM in regards to speaking with you about going back on Humira. Also wanting to know if/what blood work is needed. Pt also stated he wants an appt to see you. Please advise ?

## 2021-12-21 NOTE — Telephone Encounter (Signed)
Pt phoned back today stating he is out of Budesonide and his stomach is cutting up on him. Pt would like to have Budesonide phoned it. My note from 01/29/2021 states that the pt's PCP was going to be taking care of his ulcerative colitis. Pt wants to know about how to get started on Humira. He states he is ready at this time to begin. Please advise. ?

## 2021-12-22 ENCOUNTER — Telehealth: Payer: Self-pay | Admitting: Internal Medicine

## 2021-12-22 DIAGNOSIS — K51211 Ulcerative (chronic) proctitis with rectal bleeding: Secondary | ICD-10-CM

## 2021-12-22 MED ORDER — BUDESONIDE 3 MG PO CPEP: ORAL_CAPSULE | ORAL | 0 refills | Status: DC

## 2021-12-22 NOTE — Telephone Encounter (Signed)
Phoned and LMOVM for the pt to return call ?

## 2021-12-22 NOTE — Telephone Encounter (Signed)
Patient needs to go to [facility] to have blood work done.  All of the previous blood work I ordered in December is still active.  He needs to have this done if we are going to consider him on Humira which I think we need to. ? ?In the meantime I have refilled his budesonide. ? ?He needs OV with me in 2-3 months ?

## 2021-12-25 ENCOUNTER — Emergency Department (HOSPITAL_COMMUNITY): Payer: Medicaid Other

## 2021-12-25 ENCOUNTER — Other Ambulatory Visit: Payer: Self-pay

## 2021-12-25 ENCOUNTER — Encounter (HOSPITAL_COMMUNITY): Payer: Self-pay | Admitting: Emergency Medicine

## 2021-12-25 ENCOUNTER — Emergency Department (HOSPITAL_COMMUNITY)
Admission: EM | Admit: 2021-12-25 | Discharge: 2021-12-25 | Disposition: A | Discharge status: Home / Self Care | Payer: Medicaid Other | Attending: Emergency Medicine | Admitting: Emergency Medicine

## 2021-12-25 DIAGNOSIS — K625 Hemorrhage of anus and rectum: Secondary | ICD-10-CM | POA: Diagnosis present

## 2021-12-25 DIAGNOSIS — K51011 Ulcerative (chronic) pancolitis with rectal bleeding: Secondary | ICD-10-CM | POA: Diagnosis not present

## 2021-12-25 DIAGNOSIS — D72829 Elevated white blood cell count, unspecified: Secondary | ICD-10-CM | POA: Diagnosis not present

## 2021-12-25 LAB — COMPREHENSIVE METABOLIC PANEL
ALT: 17 U/L (ref 0–44)
AST: 17 U/L (ref 15–41)
Albumin: 3.8 g/dL (ref 3.5–5.0)
Alkaline Phosphatase: 86 U/L (ref 38–126)
Anion gap: 8 (ref 5–15)
BUN: 7 mg/dL (ref 6–20)
CO2: 25 mmol/L (ref 22–32)
Calcium: 9.1 mg/dL (ref 8.9–10.3)
Chloride: 104 mmol/L (ref 98–111)
Creatinine, Ser: 0.84 mg/dL (ref 0.61–1.24)
GFR, Estimated: >60 mL/min (ref >60)
Glucose, Bld: 98 mg/dL (ref 70–99)
Potassium: 3.7 mmol/L (ref 3.5–5.1)
Sodium: 137 mmol/L (ref 135–145)
Total Bilirubin: 0.7 mg/dL (ref 0.3–1.2)
Total Protein: 7.6 g/dL (ref 6.5–8.1)

## 2021-12-25 LAB — CBC
HCT: 44.4 % (ref 39.0–52.0)
Hemoglobin: 14.7 g/dL (ref 13.0–17.0)
MCH: 29.4 pg (ref 26.0–34.0)
MCHC: 33.1 g/dL (ref 30.0–36.0)
MCV: 88.8 fL (ref 80.0–100.0)
Platelets: 385 10*3/uL (ref 150–400)
RBC: 5 MIL/uL (ref 4.22–5.81)
RDW: 12.9 % (ref 11.5–15.5)
WBC: 16.8 10*3/uL — ABNORMAL HIGH (ref 4.0–10.5)
nRBC: 0 % (ref 0.0–0.2)

## 2021-12-25 LAB — TYPE AND SCREEN
ABO/RH(D): A NEG
Antibody Screen: NEGATIVE

## 2021-12-25 MED ORDER — PREDNISONE 10 MG PO TABS: ORAL_TABLET | ORAL | 0 refills | Status: DC

## 2021-12-25 MED ORDER — METHYLPREDNISOLONE SODIUM SUCC 125 MG IJ SOLR
125.0000 mg | Freq: Once | INTRAMUSCULAR | Status: AC
  Administered 2021-12-25: 125 mg via INTRAVENOUS
  Filled 2021-12-25: qty 2

## 2021-12-25 NOTE — Discharge Instructions (Signed)
The CT scan of your abdomen and pelvis today shows a likely flare of your ulcerative colitis.  Stop your budesonide and start the prednisone prescription tomorrow.  Someone from Dr. Ave Filter office will likely call you next week to arrange follow-up appointment.  You may call his office as well.  Return to the emergency department for any new or worsening symptoms. ?

## 2021-12-25 NOTE — ED Provider Notes (Signed)
?Jerry Cordova ?Provider Note ? ? ?CSN: [id number] ?Arrival date & time: 12/25/21  1157 ? ?  ? ?History ? ?Chief Complaint  ?Patient presents with  ? Rectal Bleeding  ? ? ?Jerry Cordova is a 21 y.o. male. ? ? ?Rectal Bleeding ?Associated symptoms: abdominal pain   ?Associated symptoms: no dizziness, no fever and no vomiting   ? ?  ? ?Jerry Cordova is a 21 y.o. male with past medical history significant for chronic ulcerative colitis who presents to the Emergency Department complaining of bloody stools x2 weeks and lower abdominal pain.  He has been off of his UC medication x2 months.  Recently started taking budesonide earlier this week.  States his abdominal pain feels like recurrent UC flares.  He denies any weakness, dizziness or syncope.  No nausea vomiting fever or chills.  He endorses having multiple colonoscopies in the past with most recent in 2019 that showed moderately active UC pancolitis ? ? ?Home Medications ?Prior to Admission medications   ?Medication Sig Start Date End Date Taking? Authorizing Provider  ?budesonide (ENTOCORT EC) 3 MG 24 hr capsule Take 3 capsules daily x8 weeks then 2 capsules daily x2 weeks then 1 capsule daily x2 weeks then stop. 12/22/21  Yes Eloise Harman, DO  ?escitalopram (LEXAPRO) 10 MG tablet Take 20 mg by mouth daily.   Yes [provider]  ?hydrOXYzine (ATARAX) 25 MG tablet Take 25 mg by mouth See admin instructions. Take 1 to 2 tablets by mouth as needed up to 4 times a day for increased anxiety. Take 2-4 at bedtime as needed for sleep.   Yes [provider]  ?HYDROcodone-acetaminophen (NORCO/VICODIN) 5-325 MG tablet Take 1 tablet by mouth every 6 (six) hours as needed for severe pain. ?Patient not taking: Reported on 12/25/2021 10/16/21   Corinne Ports, PA-C  ?   ? ?Allergies    ?Penicillins, Ivp dye [iodinated contrast media], Penicillins, and Remicade [infliximab]   ? ?Review of Systems   ?Review of Systems  ?Constitutional:   Negative for appetite change, chills and fever.  ?HENT:  Negative for trouble swallowing.   ?Respiratory:  Negative for chest tightness and shortness of breath.   ?Cardiovascular:  Negative for chest pain.  ?Gastrointestinal:  Positive for abdominal pain, anal bleeding and hematochezia. Negative for abdominal distention, nausea and vomiting.  ?Skin:  Negative for rash and wound.  ?Neurological:  Negative for dizziness, weakness and numbness.  ? ?Physical Exam ?Updated Vital Signs ?BP 116/75   Pulse 68   Temp 98.3 ?F (36.8 ?C) (Oral)   Resp 19   Ht 6' 1"  (1.854 m)   Wt 63.5 kg   SpO2 97%   BMI 18.47 kg/m?  ?Physical Exam ?Vitals and nursing note reviewed.  ?Constitutional:   ?   General: He is not in acute distress. ?   Appearance: Normal appearance. He is not ill-appearing.  ?HENT:  ?   Mouth/Throat:  ?   Mouth: Mucous membranes are moist.  ?Cardiovascular:  ?   Rate and Rhythm: Normal rate and regular rhythm.  ?   Pulses: Normal pulses.  ?Pulmonary:  ?   Effort: Pulmonary effort is normal.  ?Chest:  ?   Chest wall: No tenderness.  ?Abdominal:  ?   General: There is no distension.  ?   Palpations: Abdomen is soft.  ?   Tenderness: There is abdominal tenderness.  ?Skin: ?   General: Skin is warm.  ?   Capillary Refill:  Capillary refill takes less than 2 seconds.  ?Neurological:  ?   General: No focal deficit present.  ?   Mental Status: He is alert.  ?   Sensory: No sensory deficit.  ?   Motor: No weakness.  ? ? ?ED Results / Procedures / Treatments   ?Labs ?(all labs ordered are listed, but only abnormal results are displayed) ?Labs Reviewed  ?CBC - Abnormal; Notable for the following components:  ?    Result Value  ? WBC 16.8 (*)   ? All other components within normal limits  ?COMPREHENSIVE METABOLIC PANEL  ?POC OCCULT BLOOD, ED  ?TYPE AND SCREEN  ? ? ?EKG ?None ? ?Radiology ?CT ABDOMEN PELVIS WO CONTRAST ? ?Result Date: 12/25/2021 ?CLINICAL DATA:  Abdominal pain with dark bloody stool EXAM: CT ABDOMEN AND  PELVIS WITHOUT CONTRAST TECHNIQUE: Multidetector CT imaging of the abdomen and pelvis was performed following the standard protocol without IV contrast. RADIATION DOSE REDUCTION: This exam was performed according to the departmental dose-optimization program which includes automated exposure control, adjustment of the mA and/or kV according to patient size and/or use of iterative reconstruction technique. COMPARISON:  CT 04/07/2021 FINDINGS: Lower chest: Lung bases demonstrate no acute consolidation or effusion. Normal cardiac size Hepatobiliary: No focal liver abnormality is seen. No gallstones, gallbladder wall thickening, or biliary dilatation. Pancreas: Unremarkable. No pancreatic ductal dilatation or surrounding inflammatory changes. Spleen: Normal in size without focal abnormality. Adrenals/Urinary Tract: Adrenal glands are normal. Kidneys show no hydronephrosis. Hyperdense renal pyramids without well-formed stone. The bladder is unremarkable. Stomach/Bowel: Stomach nonenlarged. No dilated small bowel. Diffuse wall thickening involving the entire colon. The appendix is not well seen. No intramural air. Vascular/Lymphatic: No significant vascular findings are present. No enlarged abdominal or pelvic lymph nodes. Reproductive: Prostate is unremarkable. Other: Negative for free air or free fluid Musculoskeletal: Fixating screws within the left superior pubic ramus and across the right SI joint. Old pelvic fracture deformity and left inferior pubic ramus fracture deformity. IMPRESSION: 1. Wall thickening involving the entire colon, consistent with pancolitis and felt consistent with the history of ulcerative colitis. No intramural air or perforation. 2. Slightly hyperdense renal pyramids, no well-formed stone, question a degree of nephrocalcinosis Electronically Signed   By: Donavan Foil M.D.   On: 12/25/2021 17:18   ? ?Procedures ?Procedures  ? ? ?Medications Ordered in ED ?Medications  ?methylPREDNISolone  sodium succinate (SOLU-MEDROL) 125 mg/2 mL injection 125 mg (has no administration in time range)  ? ? ?ED Course/ Medical Decision Making/ A&P ?  ?                        ?Medical Decision Making ?Patient here for evaluation of lower abdominal pain and dark red stools.  History of UC.  States symptoms feel similar to previous flares.  He had been off of his UC medications x2 months.  Was started back on budesonide 4 days ago.  Describes having crampy pain of his lower abdomen.  No fever, vomiting or hematemesis. ? ?On exam, patient well-appearing.  He does have some tenderness palpation of the entire lower abdomen.  Abdomen is soft and otherwise nondistended.  Vital signs reassuring. ? ?Amount and/or Complexity of Data Reviewed ?Labs: ordered. ?   Details: Labs interpreted by me show leukocytosis with white count of 16,800.  Hemoglobin reassuring.  Chemistries unremarkable. ?Radiology: ordered. ?   Details: Noncon CT abdomen and pelvis shows wall thickening of the entire colon consistent with pancolitis no  intramural air or perforation. ?Discussion of management or test interpretation with external provider(s): Patient here symptoms likely related to UC flare.  Will discuss this with his GI provider, Dr. Abbey Chatters.  No vomiting or upper abdominal pain.  Some bloody diarrhea noted without drop in hemoglobin ? ?Discussed findings with GI, Dr. Abbey Chatters who recommends patient receive Solu-Medrol here and high-dose prednisone taper and he will follow patient closely in office.  We will have patient stop his budesonide.  Patient offered hospital admission but prefers outpatient treatment. ? ? ? ? ? ? ? ? ? ? ?Final Clinical Impression(s) / ED Diagnoses ?Final diagnoses:  ?Ulcerative pancolitis with rectal bleeding ([place])  ? ? ?Rx / DC Orders ?ED Discharge Orders   ? ? None  ? ?  ? ? ?  ?Kem Parkinson, PA-C ?12/25/21 1924 ? ?  ?Milton Ferguson, MD ?12/27/21 1044 ? ?

## 2021-12-25 NOTE — ED Triage Notes (Signed)
Pt c/o dark red bloody stools x 2 weeks. Hx of ulcerative colitis. Endorses lower abdominal pain. ?

## 2021-12-25 NOTE — ED Notes (Signed)
Went over d/c papers. Ambulatory to lobby  ?

## 2021-12-28 NOTE — Telephone Encounter (Signed)
Hey Dr. Abbey Chatters ?I spoke with this pt and he advised me that he had his bloodwork done today and was in the ED 5-12 and they wanted him seen earlier than 5/25. From the look of your schedule I advised the pt to keep appt not to cancel before speaking with me again. I reminded him of the time he is suppose to see you.  ?

## 2021-12-31 LAB — COMPLETE METABOLIC PANEL WITH GFR
AG Ratio: 1.3 (calc) (ref 1.0–2.5)
ALT: 23 U/L (ref 9–46)
AST: 19 U/L (ref 10–40)
Albumin: 4.4 g/dL (ref 3.6–5.1)
Alkaline phosphatase (APISO): 90 U/L (ref 36–130)
BUN: 10 mg/dL (ref 7–25)
CO2: 26 mmol/L (ref 20–32)
Calcium: 9.7 mg/dL (ref 8.6–10.3)
Chloride: 100 mmol/L (ref 98–110)
Creat: 0.96 mg/dL (ref 0.60–1.24)
Globulin: 3.5 g/dL (calc) (ref 1.9–3.7)
Glucose, Bld: 64 mg/dL — ABNORMAL LOW (ref 65–99)
Potassium: 3.6 mmol/L (ref 3.5–5.3)
Sodium: 139 mmol/L (ref 135–146)
Total Bilirubin: 0.3 mg/dL (ref 0.2–1.2)
Total Protein: 7.9 g/dL (ref 6.1–8.1)
eGFR: 116 mL/min/{1.73_m2} (ref >=60)

## 2021-12-31 LAB — QUANTIFERON-TB GOLD PLUS
Mitogen-NIL: 5.71 IU/mL
NIL: 0.02 IU/mL
QuantiFERON-TB Gold Plus: NEGATIVE
TB1-NIL: 0 IU/mL
TB2-NIL: 0.01 IU/mL

## 2021-12-31 LAB — CBC
HCT: 45.9 % (ref 38.5–50.0)
Hemoglobin: 15.6 g/dL (ref 13.2–17.1)
MCH: 31 pg (ref 27.0–33.0)
MCHC: 34 g/dL (ref 32.0–36.0)
MCV: 91.3 fL (ref 80.0–100.0)
MPV: 10.8 fL (ref 7.5–12.5)
Platelets: 455 10*3/uL — ABNORMAL HIGH (ref 140–400)
RBC: 5.03 10*6/uL (ref 4.20–5.80)
RDW: 12.6 % (ref 11.0–15.0)
WBC: 21.7 10*3/uL — ABNORMAL HIGH (ref 3.8–10.8)

## 2021-12-31 LAB — SEDIMENTATION RATE: Sed Rate: 14 mm/h (ref 0–15)

## 2021-12-31 LAB — HEPATITIS C ANTIBODY
Hepatitis C Ab: NONREACTIVE
SIGNAL TO CUT-OFF: 0.16 (ref <1.00)

## 2021-12-31 LAB — C-REACTIVE PROTEIN: CRP: 9.1 mg/L — ABNORMAL HIGH (ref <8.0)

## 2021-12-31 LAB — HEPATITIS B SURFACE ANTIBODY,QUALITATIVE: Hep B S Ab: REACTIVE — AB

## 2021-12-31 LAB — HEPATITIS B CORE ANTIBODY, TOTAL: Hep B Core Total Ab: NONREACTIVE

## 2021-12-31 LAB — HEPATITIS B SURFACE ANTIGEN: Hepatitis B Surface Ag: NONREACTIVE

## 2022-01-07 ENCOUNTER — Encounter: Payer: Self-pay | Admitting: Internal Medicine

## 2022-01-07 ENCOUNTER — Ambulatory Visit (INDEPENDENT_AMBULATORY_CARE_PROVIDER_SITE_OTHER): Payer: Medicaid Other | Admitting: Internal Medicine

## 2022-01-07 VITALS — BP 122/78 | HR 90 | Temp 98.0°F | Ht 73.0 in | Wt 142.0 lb

## 2022-01-07 DIAGNOSIS — K51011 Ulcerative (chronic) pancolitis with rectal bleeding: Secondary | ICD-10-CM

## 2022-01-07 DIAGNOSIS — R197 Diarrhea, unspecified: Secondary | ICD-10-CM

## 2022-01-07 NOTE — Patient Instructions (Signed)
We will start the process today of getting you back on Humira for your ulcerative colitis.  Continue prednisone until taper runs out.  I am going to order stool studies to make sure you do not have an infection of your colon.  This will need to be dropped off at [place].  Follow-up with me in 3 months.  It was good seeing you again today.  Dr. Abbey Chatters  At [facility] we value your feedback. You may receive a survey about your visit today. Please share your experience as we strive to create trusting relationships with our patients to provide genuine, compassionate, quality care.  We appreciate your understanding and patience as we review any laboratory studies, imaging, and other diagnostic tests that are ordered as we care for you. Our office policy is 5 business days for review of these results, and any emergent or urgent results are addressed in a timely manner for your best interest. If you do not hear from our office in 1 week, please contact us.   We also encourage the use of MyChart, which contains your medical information for your review as well. If you are not enrolled in this feature, an access code is on this after visit summary for your convenience. Thank you for allowing [place] to be involved in your care.  It was great to see you today!  I hope you have a great rest of your Spring!    Jerry Cordova. Abbey Chatters, D.O. Gastroenterology and Hepatology [facility]

## 2022-01-07 NOTE — Progress Notes (Signed)
Referring Provider: [facility] Primary Care Physician:  [facility]:  Dr. Abbey Chatters  Chief Complaint  Patient presents with   Ulcerative Colitis    Flare up going on for about a month.     HPI:   Jerry Cordova is a 21 y.o. male who presents to clinic today for follow-up visit in regards to ulcerative colitis.  Patient has history of ulcerative pancolitis diagnosed in 2013. Previously followed by pediatric GI at [facility].  He was on Remicade at one point, but he developed antibodies and became symptomatic so he was transitioned to Humira with methotrexate.  Notably, he also had urticarial rashes with Remicade infusions and this was treated by antihistamines.  Last seen by [facility] in May 2020 and was on Humira 40 mg every other week with methotrexate 19m weekly and folate.  Symptoms were doing well at that time and he was advised to continue his current medication regimen.  Colonoscopy and EGD in 2019: Colonoscopy impression: Moderately active ulcerative colitis; pancolitis with some patulous in the rectum, biopsied.  The examined portion of the ileum was normal s/p biopsy.  TI biopsy was benign, cecal biopsy with quiescent chronic colitis, no active colitis, ascending colon biopsy benign, transverse colon biopsy benign, mild chronic active colitis in the descending and sigmoid colon.  Rectum with mild active colitis, no chronic colitis.   EGD: Normal exam.  Duodenal biopsies with no inflammation or villous blunting.  Gastric biopsies with mild patchy chronic gastritis.  H. pylori negative.  Esophageal biopsies benign.  Patient was seen in the emergency room 11/17/2020 for abdominal pain.  Reported he has not been on any medications for UC in over a year, but felt the symptoms were similar to flares.  CBC remarkable for WBC 17.7.  BMP within normal limits.  He was prescribed Zofran and prednisone 20 mg twice daily x5 days then 1 daily for 5 days.  Stool studies negative for infection.  Hepatitis B and varicella vaccines completed 02/24/2021.  I saw patient in December 2022.  At that time he was chronically on budesonide and doing well.  We discussed treatment options for management of his UC and he decided to go back on Humira.  I ordered blood work to be performed to start that process.  Unfortunately patient never went to have blood work done.  He also never followed up with me.  States she was doing okay until May when he had worsening abdominal pain, rectal bleeding, diarrhea.  [facility].  CT showed pancolitis.  I was contacted by the ER.  I recommended stool studies and 8 week taper of prednisone.  Also recommended patient go have his blood work done and follow-up with me.  Today he is still on initial dose of 40 mg daily of prednisone.  Stool studies not completed.  He did go have his blood work performed.  CRP 9.1, ESR 17, hepatitis B surface antibody positive, core antibody negative, surface antigen negative, QuantiFERON negative.  Patient states he is starting to feel better on the prednisone.  His bowel movements have decreased.  Rectal bleeding has decreased.  Pain starting to improve.   Past Medical History:  Diagnosis Date   Anxiety    Is in therapy, not on medication   Chest wall injury 08/16/2008   Hit by baseball, radiography without fracture, discharged home   [place]    Complication of anesthesia  seizures coming out of anesthesia once   Ulcerative colitis [facility])     Past Surgical History:  Procedure Laterality Date   BALLOON DILATION N/A 01/09/2021   Procedure: BALLOON DILATION;  Surgeon: Eloise Harman, DO;  [facility];  Service: Endoscopy;  Laterality: N/A;   BIOPSY  01/09/2021   Procedure: BIOPSY;  Surgeon: Eloise Harman, DO;  [facility];  Service: Endoscopy;;  GASTRIC, MID-ESOPHAGUS   COLONOSCOPY  12/02/2011    Surgeon:  Oletha Blend, MD; entire colonic mucosa with marked friability, granularity, edema, and erythema with no skip lesions or rectal sparing.  Unable to negotiate through the ascending colon to directly visualize the cecum.  Multiple biopsies were taken and all revealed chronic active colitis consistent with IBD.   COLONOSCOPY  2019   [facility]- Moderately active UC, pancolitis with some patulous in the rectum.  Pathology: TI biopsy benign, cecal biopsy with quiescent chronic colitis, no active colitis, ascending and transverse colon biopsies benign, mild chronic active colitis in the descending and sigmoid colon, mild active colitis in the rectum.   ESOPHAGEAL BRUSHING  01/09/2021   Procedure: ESOPHAGEAL BRUSHING;  Surgeon: Eloise Harman, DO;  [facility];  Service: Endoscopy;;   ESOPHAGOGASTRODUODENOSCOPY  2019   [facility]; Normal exam.  Duodenal biopsies with no inflammation or villous blunting.  Gastric biopsies with mild patchy chronic gastritis.  H. pylori negative.  Esophageal biopsies benign.   ESOPHAGOGASTRODUODENOSCOPY (EGD) WITH PROPOFOL N/A 01/09/2021   Procedure: ESOPHAGOGASTRODUODENOSCOPY (EGD) WITH PROPOFOL;  Surgeon: Eloise Harman, DO;  [facility];  Service: Endoscopy;  Laterality: N/A;  2:45pm   FEMUR IM NAIL Left 04/05/2021   Procedure: INTRAMEDULLARY (IM) RETROGRADE FEMORAL NAILING;  Surgeon: Hiram Gash, MD;  Location: [place];  Service: Orthopedics;  Laterality: Left;   HARDWARE REMOVAL Left 10/16/2021   Procedure: HARDWARE REMOVAL DISTAL FEMUR;  Surgeon: Shona Needles, MD;  Location: [place];  Service: Orthopedics;  Laterality: Left;   I & D EXTREMITY Left 04/05/2021   Procedure: IRRIGATION AND DEBRIDEMENT LEFT LEG;  Surgeon: Hiram Gash, MD;  Location: [place];  Service: Orthopedics;  Laterality: Left;   ORIF HUMERUS FRACTURE Left 04/06/2021   Procedure: OPEN REDUCTION INTERNAL FIXATION (ORIF) DISTAL HUMERUS FRACTURE;  Surgeon: Shona Needles, MD;  Location: [place];  Service: Orthopedics;  Laterality: Left;   ORIF PELVIC FRACTURE N/A 04/06/2021   Procedure: OPEN REDUCTION INTERNAL FIXATION (ORIF) PELVIC FRACTURE;  Surgeon: Shona Needles, MD;  Location: [place];  Service: Orthopedics;  Laterality: N/A;    Current Outpatient Medications  Medication Sig Dispense Refill   escitalopram (LEXAPRO) 20 MG tablet Take 20 mg by mouth daily.     hydrOXYzine (ATARAX) 25 MG tablet Take 25 mg by mouth See admin instructions. Take 1 to 2 tablets by mouth as needed up to 4 times a day for increased anxiety. Take 2-4 at bedtime as needed for sleep.     predniSONE (DELTASONE) 10 MG tablet Take 40 mg (4 tablets) daily x2 weeks, then take 30 mg (4 tablets) daily x2 weeks, then take 20 mg (2 tablets) daily x2 weeks, then take 10 mg (1 tablet) daily x2 weeks 140 tablet 0   No current facility-administered medications for this visit.    Allergies as of 01/07/2022 - Review Complete 01/07/2022  Allergen Reaction Noted   Penicillins Hives and Rash 11/28/2011   Ivp dye [iodinated contrast media] Hives and Itching 09/21/2021  Penicillins Hives 04/05/2021   Remicade [infliximab] Hives 12/22/2020    Family History  Problem Relation Age of Onset   Diabetes Other        Maternal family   Asthma Other        maternal family   Cancer Other        maternal family   Heart disease Other        Maternal GGF later in life    Ulcerative colitis Other        Unrelated step maternal GF    GI problems Cousin        Mother began asking paternal family about GI problems and learned of 2 young cousins with "bowel problems".    Colon cancer Neg Hx     Social History   Socioeconomic History   Marital status: Single    Spouse name: Not on file   Number of children: Not on file   Years of education: Not on file   Highest education level: Not on file  Occupational History   Not on file  Tobacco Use   Smoking status: Former    Types: E-cigarettes   Smokeless tobacco: Current     Types: Chew   Tobacco comments:    Smokeless tobacco pouches  Vaping Use   Vaping Use: Former  Substance and Sexual Activity   Alcohol use: Not Currently   Drug use: Not Currently   Sexual activity: Never  Other Topics Concern   Not on file  Social History Narrative   ** Merged History Encounter **       Social Determinants of Health   Financial Resource Strain: Not on file  Food Insecurity: Not on file  Transportation Needs: Not on file  Physical Activity: Not on file  Stress: Not on file  Social Connections: Not on file    Subjective: Review of Systems  Constitutional:  Negative for chills and fever.  HENT:  Negative for congestion and hearing loss.   Eyes:  Negative for blurred vision and double vision.  Respiratory:  Negative for cough and shortness of breath.   Cardiovascular:  Negative for chest pain and palpitations.  Gastrointestinal:  Positive for abdominal pain, blood in stool and diarrhea. Negative for constipation, heartburn, melena and vomiting.  Genitourinary:  Negative for dysuria and urgency.  Musculoskeletal:  Negative for joint pain and myalgias.  Skin:  Negative for itching and rash.  Neurological:  Negative for dizziness and headaches.  Psychiatric/Behavioral:  Negative for depression. The patient is not nervous/anxious.     Objective: BP 122/78 (BP Location: Right Arm, Patient Position: Sitting, Cuff Size: Normal)   Pulse 90   Temp 98 F (36.7 C) (Temporal)   Ht 6' 1"  (1.854 m)   Wt 142 lb (64.4 kg)   SpO2 97%   BMI 18.73 kg/m  Physical Exam Constitutional:      Appearance: Normal appearance.  HENT:     Head: Normocephalic and atraumatic.  Eyes:     Extraocular Movements: Extraocular movements intact.     Conjunctiva/sclera: Conjunctivae normal.  Cardiovascular:     Rate and Rhythm: Normal rate and regular rhythm.  Pulmonary:     Effort: Pulmonary effort is normal.     Breath sounds: Normal breath sounds.  Abdominal:      General: Bowel sounds are normal.     Palpations: Abdomen is soft.  Musculoskeletal:        General: Normal range of motion.     Cervical back:  Normal range of motion and neck supple.  Skin:    General: Skin is warm.  Neurological:     General: No focal deficit present.     Mental Status: He is alert and oriented to person, place, and time.  Psychiatric:        Mood and Affect: Mood normal.        Behavior: Behavior normal.     Assessment: *Ulcerative pancolitis *Diarrhea *Rectal bleeding  Plan: Discussed ulcerative colitis in depth with patient today.  Unfortunately he did not follow-up with me after previous visit.  He now states that he is proactive and would like to get on biologic therapy.  Discussed options with him in depth today.  He would like to pursue Humira and methotrexate which he was on previously as his symptoms were well controlled.    Hepatitis B and QuantiFERON testing negative.  We will start the process today of obtaining Humira.  Will need loading dose 160 mg then 80 mg day 15 then 40 mg every other week starting day 29.  We will plan on colonoscopy to evaluate disease activity in 6-12 months.  I will order stool studies to rule out infectious cause of patient's colitis as this was not done prior.  Follow-up in 3 months.  01/07/2022 1:44 PM   Disclaimer: This note was dictated with voice recognition software. Similar sounding words can inadvertently be transcribed and may not be corrected upon review.

## 2022-01-13 ENCOUNTER — Telehealth: Payer: Self-pay

## 2022-01-13 NOTE — Telephone Encounter (Signed)
Hey Dr Abbey Chatters Once you sign off on the pt's labs I can add to the package for you to sign off on for the pt to begin Humira.

## 2022-01-13 NOTE — Telephone Encounter (Signed)
Okay to proceed.  Thank you

## 2022-01-14 NOTE — Telephone Encounter (Signed)
noted 

## 2022-01-15 NOTE — Telephone Encounter (Signed)
Paperwork done and sent to [facility]. [employer], insurance, and Rx)

## 2022-01-19 NOTE — Telephone Encounter (Signed)
[facility] has sent a medication clarification request for this pt's Humira. I have put the pt's pharmacy in [place] in the chart. I have the original paperwork on my desk if you need it

## 2022-02-09 ENCOUNTER — Telehealth: Payer: Self-pay | Admitting: Internal Medicine

## 2022-02-09 MED ORDER — HUMIRA-CD/UC/HS STARTER 40 MG/0.8ML ~~LOC~~ PNKT: PEN_INJECTOR | SUBCUTANEOUS | 4 refills | Status: DC

## 2022-02-11 ENCOUNTER — Other Ambulatory Visit: Payer: Self-pay | Admitting: Internal Medicine

## 2022-02-11 MED ORDER — PREDNISONE 10 MG PO TABS: ORAL_TABLET | ORAL | 0 refills | Status: DC

## 2022-02-11 NOTE — Telephone Encounter (Signed)
Returned the pt's vm on phone and advised of Rx being sent to his pharmacy. Pt expressed understanding.  I asked the pt has he received his shipment yet and he said no. I will check on this tomorrow

## 2022-02-11 NOTE — Telephone Encounter (Signed)
4 more weeks of prednisone sent to pharmacy

## 2022-02-11 NOTE — Telephone Encounter (Signed)
Phoned and LMOVM for the pt to call me back regarding Rx

## 2022-02-12 NOTE — Telephone Encounter (Signed)
Phoned and spoke with the pt and was advised his shipment for his medication has not arrived yet. I will check the system Monday but the pt has the Prednisone to take for now

## 2022-02-17 NOTE — Telephone Encounter (Signed)
Phoned and LMOVM for the pt to return call regarding his Humira shipment

## 2022-02-18 NOTE — Telephone Encounter (Signed)
Pt phoned and LMOVM for me regarding his shipment was still in process and it has not shipped. Will contact [facility] tomorrow to find out what's going on

## 2022-02-18 NOTE — Telephone Encounter (Signed)
Phoned the pt and inquired of his Humira shipment and he hasn't gotten it yet. Will follow up with company to see where the shipment is for the pt.

## 2022-02-19 NOTE — Telephone Encounter (Signed)
Phoned and spoke with Janett Billow @ [facility] [phone]) advised that pt reported to me that he has not received his shipment of Humira. She had me on hold while she tried to schedule with the pt and no ans/vm. I advised her that I will call him. Advised that medication will be put on hold until pt calls. I phoned and LMOVM of the pt detailing what he needs to do to schedule his shipment. (Phone number left for him on vm as well)

## 2022-02-22 NOTE — Telephone Encounter (Signed)
Phoned and LMOVM for the pt to call me regarding his medication

## 2022-02-25 NOTE — Telephone Encounter (Signed)
Phoned and LMOVM of the pt regarding his shipment whether he has it or not or I will assume he has it and put file away

## 2022-03-01 NOTE — Telephone Encounter (Signed)
FYI:  Phoned the pt this morning and asked had he gotten his shipment of Humira yet and he said no. I asked him had he received his vm's that were left and he said no. So I gave the pt all his information needed to call [facility] to set up a time for them to ship the medication to him.

## 2022-03-02 ENCOUNTER — Encounter: Payer: Self-pay | Admitting: Internal Medicine

## 2022-03-02 NOTE — Telephone Encounter (Signed)
Noted, hopefully he gets this done

## 2022-03-02 NOTE — Telephone Encounter (Signed)
FYI:  Dr Abbey Chatters,  I called and spoke to the pt he has not called to schedule for his Humira to be shipped.  He states he hasn't had the time to do it today. So I advised that this needs to be done because prednisone is not the treatment and he may not get it again since it's in his hands to call and get his Humira shipped to his home. Pt stated he will try and find time today to do this.

## 2022-03-02 NOTE — Telephone Encounter (Signed)
FYI:  Pt returned call and his Humira will be delivered tomorrow, July 19th to his home.

## 2022-03-03 NOTE — Telephone Encounter (Signed)
noted 

## 2022-03-03 NOTE — Telephone Encounter (Signed)
Great - thanks

## 2022-03-23 ENCOUNTER — Telehealth: Payer: Self-pay

## 2022-03-23 NOTE — Telephone Encounter (Signed)
Pt called and LMOVM regarding his Humira is not working for him anymore.for a couple of weeks he has had blood and mucus in his stools. He has been having 5 to 8 bloody stools a day. He has taken his first 4 pen dose (160 mg), 2nd dose of 2 pens and now he is doing 40 mg every other week. Please advise what the pt needs to do next since he states his medication not working.

## 2022-03-25 NOTE — Telephone Encounter (Signed)
This medication does take time to build up in his system.  Is he still on steroids?

## 2022-03-25 NOTE — Telephone Encounter (Signed)
Pt called back and stated that he is having more bloody stools and his abdomen pain is no better. Please advise a visit here or ED.

## 2022-03-25 NOTE — Telephone Encounter (Signed)
Phoned and spoke with the pt and advised of the medication taking time to build up in his system. Pt stopped taking the steroids once he started taking his Humira. The pt stated he does have steroids left. Please advise regarding the pt.

## 2022-03-26 ENCOUNTER — Other Ambulatory Visit: Payer: Self-pay

## 2022-03-26 ENCOUNTER — Emergency Department (HOSPITAL_COMMUNITY)
Admission: EM | Admit: 2022-03-26 | Discharge: 2022-03-26 | Disposition: A | Discharge status: Home / Self Care | Payer: Medicaid Other | Attending: Emergency Medicine | Admitting: Emergency Medicine

## 2022-03-26 ENCOUNTER — Encounter (HOSPITAL_COMMUNITY): Payer: Self-pay | Admitting: Emergency Medicine

## 2022-03-26 ENCOUNTER — Emergency Department (HOSPITAL_COMMUNITY): Payer: Medicaid Other

## 2022-03-26 DIAGNOSIS — K5100 Ulcerative (chronic) pancolitis without complications: Secondary | ICD-10-CM | POA: Insufficient documentation

## 2022-03-26 DIAGNOSIS — R1030 Lower abdominal pain, unspecified: Secondary | ICD-10-CM | POA: Diagnosis present

## 2022-03-26 LAB — COMPREHENSIVE METABOLIC PANEL
ALT: 12 U/L (ref 0–44)
AST: 16 U/L (ref 15–41)
Albumin: 3.6 g/dL (ref 3.5–5.0)
Alkaline Phosphatase: 84 U/L (ref 38–126)
Anion gap: 6 (ref 5–15)
BUN: 7 mg/dL (ref 6–20)
CO2: 26 mmol/L (ref 22–32)
Calcium: 9 mg/dL (ref 8.9–10.3)
Chloride: 105 mmol/L (ref 98–111)
Creatinine, Ser: 0.92 mg/dL (ref 0.61–1.24)
GFR, Estimated: >60 mL/min (ref >60)
Glucose, Bld: 125 mg/dL — ABNORMAL HIGH (ref 70–99)
Potassium: 3.8 mmol/L (ref 3.5–5.1)
Sodium: 137 mmol/L (ref 135–145)
Total Bilirubin: 0.6 mg/dL (ref 0.3–1.2)
Total Protein: 7.9 g/dL (ref 6.5–8.1)

## 2022-03-26 LAB — CBC
HCT: 44.9 % (ref 39.0–52.0)
Hemoglobin: 15.2 g/dL (ref 13.0–17.0)
MCH: 30.6 pg (ref 26.0–34.0)
MCHC: 33.9 g/dL (ref 30.0–36.0)
MCV: 90.3 fL (ref 80.0–100.0)
Platelets: 378 10*3/uL (ref 150–400)
RBC: 4.97 MIL/uL (ref 4.22–5.81)
RDW: 13.7 % (ref 11.5–15.5)
WBC: 15 10*3/uL — ABNORMAL HIGH (ref 4.0–10.5)
nRBC: 0 % (ref 0.0–0.2)

## 2022-03-26 LAB — TYPE AND SCREEN
ABO/RH(D): A NEG
Antibody Screen: NEGATIVE

## 2022-03-26 LAB — C DIFFICILE QUICK SCREEN W PCR REFLEX
C Diff antigen: NEGATIVE
C Diff interpretation: NOT DETECTED
C Diff toxin: NEGATIVE

## 2022-03-26 MED ORDER — PREDNISONE 10 MG PO TABS: ORAL_TABLET | ORAL | 0 refills | Status: DC

## 2022-03-26 NOTE — ED Triage Notes (Signed)
Pt presents with melena that that has become progressively works over last 3 weeks, started new med for Ulcerative Colitis around the same time.

## 2022-03-26 NOTE — ED Provider Notes (Signed)
[facility] EMERGENCY DEPARTMENT Provider Note   CSN: [id number] Arrival date & time: 03/26/22  1127     History {Add pertinent medical, surgical, social history, OB history to HPI:1} Chief Complaint  Patient presents with  . Melena    Jerry Cordova is a 21 y.o. male.  HPI      Jerry Cordova is a 21 y.o. male with past medical history of ulcerative colitis who presents to the Emergency Department complaining of increased diarrhea and bright red blood per rectum x 1 to 2 weeks.  States he was switched from prednisone to Humira in late June.  Since that time, he notes having increased diarrhea.  He began noticing lower abdominal cramping and bloody bowel movements.  He states he sees blood every time he defecates.  He also endorses having increased mucus in his stools.  Abdominal cramping prior to defecation.  Denies any flank or back pain, fever or chills.  No nausea or vomiting.  He is having some decreased appetite but continues to drink fluids without difficulty.  He contacted his GI's office today and was advised to come to the emergency department for further evaluation.   Home Medications Prior to Admission medications   Medication Sig Start Date End Date Taking? Authorizing Provider  Adalimumab (HUMIRA PEN-CD/UC/HS STARTER) 40 MG/0.8ML PNKT 160 mg subcutaneously day 1, then 80 mg subcutaneously on Day 15, and then 40 mg subcutaneously every other week starting on Day 29 02/09/22   Eloise Harman, DO  escitalopram (LEXAPRO) 20 MG tablet Take 20 mg by mouth daily.    [provider]  hydrOXYzine (ATARAX) 25 MG tablet Take 25 mg by mouth See admin instructions. Take 1 to 2 tablets by mouth as needed up to 4 times a day for increased anxiety. Take 2-4 at bedtime as needed for sleep.    [provider]  predniSONE (DELTASONE) 10 MG tablet Take  20 mg (2 tablets) daily x2 weeks, then take 10 mg (1 tablet) daily x2 weeks 02/11/22   Eloise Harman, DO       Allergies    Penicillins, Ivp dye [iodinated contrast media], Penicillins, and Remicade [infliximab]    Review of Systems   Review of Systems  Constitutional:  Positive for appetite change. Negative for chills and fever.  Respiratory:  Negative for shortness of breath.   Cardiovascular:  Negative for chest pain.  Gastrointestinal:  Positive for abdominal pain, blood in stool and diarrhea. Negative for nausea and vomiting.  Genitourinary:  Negative for difficulty urinating.  Musculoskeletal:  Negative for arthralgias.  Skin:  Negative for rash.  Neurological:  Negative for dizziness, syncope, weakness and numbness.    Physical Exam Updated Vital Signs BP (!) 150/67 (BP Location: Right Arm)   Pulse 79   Temp 98.4 F (36.9 C) (Oral)   Resp 18   Ht 6' 1"  (1.854 m)   Wt 63.5 kg   SpO2 98%   BMI 18.47 kg/m  Physical Exam Vitals and nursing note reviewed.  Constitutional:      General: He is not in acute distress.    Appearance: Normal appearance. He is not toxic-appearing.  HENT:     Mouth/Throat:     Mouth: Mucous membranes are moist.  Cardiovascular:     Rate and Rhythm: Normal rate and regular rhythm.     Pulses: Normal pulses.  Pulmonary:     Effort: Pulmonary effort is normal. No respiratory distress.  Abdominal:  General: There is no distension.     Palpations: Abdomen is soft.     Tenderness: There is no abdominal tenderness. There is no right CVA tenderness, left CVA tenderness or guarding.  Musculoskeletal:        General: Normal range of motion.  Skin:    General: Skin is warm.     Capillary Refill: Capillary refill takes less than 2 seconds.  Neurological:     General: No focal deficit present.     Mental Status: He is alert.    ED Results / Procedures / Treatments   Labs (all labs ordered are listed, but only abnormal results are displayed) Labs Reviewed  COMPREHENSIVE METABOLIC PANEL - Abnormal; Notable for the following components:      Result  Value   Glucose, Bld 125 (*)    All other components within normal limits  CBC - Abnormal; Notable for the following components:   WBC 15.0 (*)    All other components within normal limits  GASTROINTESTINAL PANEL BY PCR, STOOL (REPLACES STOOL CULTURE)  C DIFFICILE QUICK SCREEN W PCR REFLEX    POC OCCULT BLOOD, ED  TYPE AND SCREEN    EKG None  Radiology CT ABDOMEN PELVIS WO CONTRAST  Result Date: 03/26/2022 CLINICAL DATA:  Melena. This has progressively worsened over last 3 weeks. Patient started a new medication for ultrasound of colitis. Abdominal pain. EXAM: CT ABDOMEN AND PELVIS WITHOUT CONTRAST TECHNIQUE: Multidetector CT imaging of the abdomen and pelvis was performed following the standard protocol without IV contrast. RADIATION DOSE REDUCTION: This exam was performed according to the departmental dose-optimization program which includes automated exposure control, adjustment of the mA and/or kV according to patient size and/or use of iterative reconstruction technique. COMPARISON:  12/25/2021. FINDINGS: Lower chest: Clear lung bases. Hepatobiliary: No focal liver abnormality is seen. No gallstones, gallbladder wall thickening, or biliary dilatation. Pancreas: Unremarkable. No pancreatic ductal dilatation or surrounding inflammatory changes. Spleen: Normal in size without focal abnormality. Adrenals/Urinary Tract: Normal adrenal glands. Kidneys normal in size orientation and position. 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 No renal mass, discrete stone or hydronephrosis. Normal ureters. Bladder decompressed, otherwise unremarkable. Stomach/Bowel: Left colon decompressed, not well assessed. Mild wall thickening of the ascending and transverse colon, similar to the prior CT. Subcentimeter prominent mesenteric lymph nodes. Subtle inflammatory haziness in the pericolonic fat. Stomach is unremarkable. Small bowel is normal in caliber  without wall thickening or evidence of inflammation. Vascular/Lymphatic: No vascular abnormality. No pathologically enlarged lymph nodes. Reproductive: Unremarkable. Other: No ascites. Musculoskeletal: No acute fracture. Prior ORIF right hemipelvis fractures, screws crossing the right ilium and SI joint, stable. No acute fracture. No bone lesion. IMPRESSION: 1. Colonic wall thickening that is similar to the prior CT, currently evident along the ascending and transverse colon, descending colon decompressed and not well assessed. There is subtle adjacent inflammatory haziness. There also multiple subcentimeter, but prominent mesenteric lymph nodes. 2. No evidence of an abscess. No extraluminal air. No other acute finding. 3. 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 Electronically Signed   By: Lajean Manes M.D.   On: 03/26/2022 14:47    Procedures Procedures  {Document cardiac monitor, telemetry assessment procedure when appropriate:1}  Medications Ordered in ED Medications - No data to display  ED Course/ Medical Decision Making/ A&P                           Medical  Decision Making Patient here with known history of UC, followed by GI.  Will switch from prednisone to Humira in late June.  Since that time has noted increased diarrhea and now having bright red blood and increased mucus in his stools.  Some lower abdominal cramping just prior to defecating.  On exam, patient well-appearing nontoxic.  He is afebrile, vitals are reassuring.  No notable abdominal discomfort on exam.  Differential would include worsening of his UC, abscess, perforation, gastroenteritis.  No concerning symptoms suggestive of sepsis at this time.  Patient will need labs and imaging.  Will likely consult GI  Amount and/or Complexity of Data Reviewed Labs: ordered.    Details: Labs interpreted by me, no evidence of leukocytosis.  Hemoglobin reassuring.  Chemistries without significant  derangement. Radiology: ordered.    Details: CT abdomen and pelvis showing colonic wall thickening evident along the ascending and transverse colon no evidence of abscess. Discussion of management or test interpretation with external provider(s): On recheck, patient resting comfortably.  Few episodes of diarrhea.  He has been requesting food.  Well-appearing.  Nontoxic.  Vital signs been reassuring.  Offered hospital admission, but patient declined stating he feels well enough to go home and prefers outpatient treatment.  Discussed findings with GI, Dr. Abbey Chatters who recommends 4-week steroid taper and he will follow-up patient in clinic.  Patient agreeable to this plan and return precautions were discussed.  All questions were answered.     ***  {Document critical care time when appropriate:1} {Document review of labs and clinical decision tools ie heart score, Chads2Vasc2 etc:1}  {Document your independent review of radiology images, and any outside records:1} {Document your discussion with family members, caretakers, and with consultants:1} {Document social determinants of health affecting pt's care:1} {Document your decision making why or why not admission, treatments were needed:1} Final Clinical Impression(s) / ED Diagnoses Final diagnoses:  None    Rx / DC Orders ED Discharge Orders     None

## 2022-03-26 NOTE — Discharge Instructions (Signed)
Stop the Humira and start the prednisone, take as directed.  Someone from Dr. Ave Filter office will be contacting you to arrange follow-up appointment.  If you do not hear from them please contact their office next week.  Return to emergency department for any new or worsening symptoms

## 2022-03-26 NOTE — Telephone Encounter (Signed)
Dr Abbey Chatters, Pt called back this morning advising he is having more blood in his stools. Pt has been advised to go to the ED to be evaluated. Pt expressed understanding and agreed to do so

## 2022-03-27 LAB — GASTROINTESTINAL PANEL BY PCR, STOOL (REPLACES STOOL CULTURE)

## 2022-04-12 ENCOUNTER — Telehealth: Payer: Self-pay

## 2022-04-12 NOTE — Telephone Encounter (Signed)
Pt called stating that he has about a day and a half left of the prednisone left and is wanting to know if he can get a refill. Pt has an appt on 04/21/2022 to see you for a follow up from his recent ED visit. Pt is requesting that this be sent to [facility] [address].

## 2022-04-13 ENCOUNTER — Other Ambulatory Visit: Payer: Self-pay

## 2022-04-13 MED ORDER — PREDNISONE 10 MG PO TABS: ORAL_TABLET | ORAL | 0 refills | Status: DC

## 2022-04-13 NOTE — Telephone Encounter (Signed)
Sent in refill per verbal order by Dr. Abbey Chatters.

## 2022-04-13 NOTE — Telephone Encounter (Signed)
Pt called back regarding this.  

## 2022-04-20 ENCOUNTER — Other Ambulatory Visit: Payer: Self-pay | Admitting: Internal Medicine

## 2022-04-21 ENCOUNTER — Ambulatory Visit: Payer: Medicaid Other | Admitting: Internal Medicine

## 2022-05-19 ENCOUNTER — Encounter: Payer: Self-pay | Admitting: Internal Medicine

## 2022-05-19 ENCOUNTER — Other Ambulatory Visit (INDEPENDENT_AMBULATORY_CARE_PROVIDER_SITE_OTHER): Payer: Self-pay | Admitting: *Deleted

## 2022-05-19 ENCOUNTER — Ambulatory Visit (INDEPENDENT_AMBULATORY_CARE_PROVIDER_SITE_OTHER): Payer: Medicaid Other | Admitting: Internal Medicine

## 2022-05-19 VITALS — BP 132/81 | HR 77 | Temp 98.0°F | Ht 73.0 in | Wt 148.0 lb

## 2022-05-19 DIAGNOSIS — K51011 Ulcerative (chronic) pancolitis with rectal bleeding: Secondary | ICD-10-CM | POA: Diagnosis not present

## 2022-05-19 DIAGNOSIS — R197 Diarrhea, unspecified: Secondary | ICD-10-CM

## 2022-05-19 MED ORDER — RINVOQ 45 MG PO TB24: ORAL_TABLET | ORAL | 1 refills | Status: DC

## 2022-05-19 MED ORDER — RINVOQ 30 MG PO TB24: ORAL_TABLET | ORAL | 11 refills | Status: DC

## 2022-05-19 NOTE — Progress Notes (Signed)
Referring Provider: [facility]* Primary Care Physician:  [facility]:  Dr. Abbey Chatters  Chief Complaint  Patient presents with   Ulcerative Colitis    ED follow up on UC. Reports doing better. Taking prednisone 34m one daily. Has stopped humira.     HPI:   Jerry GOZAis a 21y.o. male who presents to clinic today for follow-up visit in regards to ulcerative colitis.  Patient has history of ulcerative pancolitis diagnosed in 2013. [facility]  He was on Remicade at one point, but he developed antibodies and became symptomatic so he was transitioned to Humira with methotrexate.  Notably, he also had urticarial rashes with Remicade infusions and this was treated by antihistamines.  Last seen by [facility] in May 2020 and was on Humira 40 mg every other week with methotrexate 175mweekly and folate.  Symptoms were doing well at that time and he was advised to continue his current medication regimen.  Colonoscopy and EGD in 2019: Colonoscopy impression: Moderately active ulcerative colitis; pancolitis with some patulous in the rectum, biopsied.  The examined portion of the ileum was normal s/p biopsy.  TI biopsy was benign, cecal biopsy with quiescent chronic colitis, no active colitis, ascending colon biopsy benign, transverse colon biopsy benign, mild chronic active colitis in the descending and sigmoid colon.  Rectum with mild active colitis, no chronic colitis.   EGD: Normal exam.  Duodenal biopsies with no inflammation or villous blunting.  Gastric biopsies with mild patchy chronic gastritis.  H. pylori negative.  Esophageal biopsies benign.  Patient was seen in the emergency room 11/17/2020 for abdominal pain.  Reported he has not been on any medications for UC in over a year, but felt the symptoms were similar to flares.  CBC remarkable for WBC 17.7.  BMP within normal limits.  He was prescribed Zofran and  prednisone 20 mg twice daily x5 days then 1 daily for 5 days. Stool studies negative for infection.  Hepatitis B and varicella vaccines completed 02/24/2021.  I saw patient in December 2022.  At that time he was chronically on budesonide and doing well.  We discussed treatment options for management of his UC and he decided to go back on Humira.  I ordered blood work to be performed to start that process.  Unfortunately patient never went to have blood work done.  He also never followed up with me.  States he was doing okay until May 2023 when he had worsening abdominal pain, rectal bleeding, diarrhea.  [facility]  CT showed pancolitis.  I was contacted by the ER.  I recommended stool studies and 8 week taper of prednisone.  Also recommended patient go have his blood work done and follow-up with me.  Followed up with me May 2023.  Stool studies not completed.  He did go have his blood work performed.  CRP 9.1, ESR 17, hepatitis B surface antibody positive, core antibody negative, surface antigen negative, QuantiFERON negative.  He was restarted on Humira.  Took this medication for 3 to 4 months without improvement in his symptoms.  [facility] with abdominal pain, rectal bleeding, diarrhea.  CT abdomen pelvis with contrast showed colon wall thickening in the ascending and transverse colon.  Given another prednisone taper.  Was supposed to see me early September but he had to reschedule.  Today, states he is doing well.  Having 2 normal bowel movements.  Continues on prednisone.  Currently on 10 mg daily.  Wishes to discuss options as far as chronic ulcerative colitis management.  Past Medical History:  Diagnosis Date   Anxiety    Is in therapy, not on medication   Chest wall injury 08/16/2008   Hit by baseball, radiography without fracture, discharged home   Chronic ulcerative colitis ([place])    Complication of anesthesia    seizures coming out  of anesthesia once   Ulcerative colitis [facility])     Past Surgical History:  Procedure Laterality Date   BALLOON DILATION N/A 01/09/2021   Procedure: BALLOON DILATION;  Surgeon: Eloise Harman, DO;  [facility];  Service: Endoscopy;  Laterality: N/A;   BIOPSY  01/09/2021   Procedure: BIOPSY;  Surgeon: Eloise Harman, DO;  [facility];  Service: Endoscopy;;  GASTRIC, MID-ESOPHAGUS   COLONOSCOPY  12/02/2011    Surgeon: Oletha Blend, MD; entire colonic mucosa with marked friability, granularity, edema, and erythema with no skip lesions or rectal sparing.  Unable to negotiate through the ascending colon to directly visualize the cecum.  Multiple biopsies were taken and all revealed chronic active colitis consistent with IBD.   COLONOSCOPY  2019   [facility]- Moderately active UC, pancolitis with some patulous in the rectum.  Pathology: TI biopsy benign, cecal biopsy with quiescent chronic colitis, no active colitis, ascending and transverse colon biopsies benign, mild chronic active colitis in the descending and sigmoid colon, mild active colitis in the rectum.   ESOPHAGEAL BRUSHING  01/09/2021   Procedure: ESOPHAGEAL BRUSHING;  Surgeon: Eloise Harman, DO;  [facility];  Service: Endoscopy;;   ESOPHAGOGASTRODUODENOSCOPY  2019   [facility]; Normal exam.  Duodenal biopsies with no inflammation or villous blunting.  Gastric biopsies with mild patchy chronic gastritis.  H. pylori negative.  Esophageal biopsies benign.   ESOPHAGOGASTRODUODENOSCOPY (EGD) WITH PROPOFOL N/A 01/09/2021   Procedure: ESOPHAGOGASTRODUODENOSCOPY (EGD) WITH PROPOFOL;  Surgeon: Eloise Harman, DO;  [facility];  Service: Endoscopy;  Laterality: N/A;  2:45pm   FEMUR IM NAIL Left 04/05/2021   Procedure: INTRAMEDULLARY (IM) RETROGRADE FEMORAL NAILING;  Surgeon: Hiram Gash, MD;  Location: [place];  Service: Orthopedics;  Laterality: Left;   HARDWARE REMOVAL Left 10/16/2021   Procedure:  HARDWARE REMOVAL DISTAL FEMUR;  Surgeon: Shona Needles, MD;  Location: [place];  Service: Orthopedics;  Laterality: Left;   I & D EXTREMITY Left 04/05/2021   Procedure: IRRIGATION AND DEBRIDEMENT LEFT LEG;  Surgeon: Hiram Gash, MD;  Location: [place];  Service: Orthopedics;  Laterality: Left;   ORIF HUMERUS FRACTURE Left 04/06/2021   Procedure: OPEN REDUCTION INTERNAL FIXATION (ORIF) DISTAL HUMERUS FRACTURE;  Surgeon: Shona Needles, MD;  Location: [place] [facility];  Service: Orthopedics;  Laterality: Left;   ORIF PELVIC FRACTURE N/A 04/06/2021   Procedure: OPEN REDUCTION INTERNAL FIXATION (ORIF) PELVIC FRACTURE;  Surgeon: Shona Needles, MD;  Location: [place];  Service: Orthopedics;  Laterality: N/A;    Current Outpatient Medications  Medication Sig Dispense Refill   escitalopram (LEXAPRO) 20 MG tablet Take 20 mg by mouth daily.     hydrOXYzine (ATARAX) 25 MG tablet Take 25 mg by mouth See admin instructions. Take 1 to 2 tablets by mouth as needed up to 4 times a day for increased anxiety. Take 2-4 at bedtime as needed for sleep.     predniSONE (DELTASONE) 10 MG tablet Take 40 mg daily x1  week, then take 30 mg daily x1 week, then take 20 mg daily x1 week, then take 10 mg daily x1 week 70 tablet 0   Adalimumab (HUMIRA PEN-CD/UC/HS STARTER) 40 MG/0.8ML PNKT 160 mg subcutaneously day 1, then 80 mg subcutaneously on Day 15, and then 40 mg subcutaneously every other week starting on Day 29 (Patient not taking: Reported on 05/19/2022) 6 each 4   No current facility-administered medications for this visit.    Allergies as of 05/19/2022 - Review Complete 05/19/2022  Allergen Reaction Noted   Penicillins Hives and Rash 11/28/2011   Ivp dye [iodinated contrast media] Hives and Itching 09/21/2021   Penicillins Hives 04/05/2021   Remicade [infliximab] Hives 12/22/2020    Family History  Problem Relation Age of Onset   Diabetes Other        Maternal family   Asthma Other        maternal family   Cancer Other         maternal family   Heart disease Other        Maternal GGF later in life    Ulcerative colitis Other        Unrelated step maternal GF    GI problems Cousin        Mother began asking paternal family about GI problems and learned of 2 young cousins with "bowel problems".    Colon cancer Neg Hx     Social History   Socioeconomic History   Marital status: Single    Spouse name: Not on file   Number of children: Not on file   Years of education: Not on file   Highest education level: Not on file  Occupational History   Not on file  Tobacco Use   Smoking status: Former    Types: E-cigarettes    Passive exposure: Past   Smokeless tobacco: Former    Types: Nurse, children's Use: Former  Substance and Sexual Activity   Alcohol use: Not Currently   Drug use: Not Currently   Sexual activity: Never  Other Topics Concern   Not on file  Social History Narrative   ** Merged History Encounter **       Social Determinants of Health   Financial Resource Strain: Not on file  Food Insecurity: Not on file  Transportation Needs: Not on file  Physical Activity: Not on file  Stress: Not on file  Social Connections: Not on file    Subjective: Review of Systems  Constitutional:  Negative for chills and fever.  HENT:  Negative for congestion and hearing loss.   Eyes:  Negative for blurred vision and double vision.  Respiratory:  Negative for cough and shortness of breath.   Cardiovascular:  Negative for chest pain and palpitations.  Gastrointestinal:  Positive for abdominal pain, blood in stool and diarrhea. Negative for constipation, heartburn, melena and vomiting.  Genitourinary:  Negative for dysuria and urgency.  Musculoskeletal:  Negative for joint pain and myalgias.  Skin:  Negative for itching and rash.  Neurological:  Negative for dizziness and headaches.  Psychiatric/Behavioral:  Negative for depression. The patient is not nervous/anxious.       Objective: BP 132/81 (BP Location: Right Arm, Patient Position: Sitting, Cuff Size: Normal)   Pulse 77   Temp 98 F (36.7 C) (Oral)   Ht _0  (1.854 m)   Wt 148 lb (67.1 kg)   BMI 19.53 kg/m  Physical Exam Constitutional:  Appearance: Normal appearance.  HENT:     Head: Normocephalic and atraumatic.  Eyes:     Extraocular Movements: Extraocular movements intact.     Conjunctiva/sclera: Conjunctivae normal.  Cardiovascular:     Rate and Rhythm: Normal rate and regular rhythm.  Pulmonary:     Effort: Pulmonary effort is normal.     Breath sounds: Normal breath sounds.  Abdominal:     General: Bowel sounds are normal.     Palpations: Abdomen is soft.  Musculoskeletal:        General: Normal range of motion.     Cervical back: Normal range of motion and neck supple.  Skin:    General: Skin is warm.  Neurological:     General: No focal deficit present.     Mental Status: He is alert and oriented to person, place, and time.  Psychiatric:        Mood and Affect: Mood normal.        Behavior: Behavior normal.      Assessment: *Ulcerative pancolitis *Diarrhea *Rectal bleeding  Plan: Discussed ulcerative colitis in depth with patient today.  Unfortunately he has missed multiple follow-ups with me.  Discussed options with him in depth today.  He has failed Remicade and Humira.  Would like to try different class of medications.  Discussed multiple options including Entyvio, Stelara, Rinvoq. He would like to pursue Rinvoq.   Hepatitis B and QuantiFERON testing negative May 2023.  We will start patient on Rinvoq 45 mg daily x8 weeks at which point he will decrease to 30 mg daily.   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   Counseled patient on the increased risk of blood clot while taking this class of medications as well as side effects from decreased immune system and he understands.   Continue prednisone for full taper.  Surveillance  colonoscopy 6 to 12 months after starting treatment.   Follow-up in 4 weeks.  We will check CBC, LFTs.  We will need to check lipids at week 12.  05/19/2022 2:58 PM   Disclaimer: This note was dictated with voice recognition software. Similar sounding words can inadvertently be transcribed and may not be corrected upon review.

## 2022-05-19 NOTE — Patient Instructions (Signed)
I am going to start the process of starting you on Rinvoq for your ulcerative colitis.  You will take 45 mg daily x8 weeks then decrease to 30 mg daily thereafter.  We need to check blood work in 4 weeks and again in 3 months after starting medication.  You can take the medication with or without food.  Try to take the pill at the same time every day.    Continue full prednisone taper.  Follow-up in 4 weeks.  It was nice seeing you again today.  Dr. Abbey Chatters

## 2022-05-20 ENCOUNTER — Telehealth: Payer: Self-pay

## 2022-05-20 ENCOUNTER — Other Ambulatory Visit (INDEPENDENT_AMBULATORY_CARE_PROVIDER_SITE_OTHER): Payer: Self-pay | Admitting: *Deleted

## 2022-05-20 ENCOUNTER — Other Ambulatory Visit: Payer: Self-pay | Admitting: Internal Medicine

## 2022-05-20 MED ORDER — RINVOQ 45 MG PO TB24: ORAL_TABLET | ORAL | 1 refills | Status: DC

## 2022-05-20 MED ORDER — RINVOQ 30 MG PO TB24: ORAL_TABLET | ORAL | 11 refills | Status: DC

## 2022-05-20 NOTE — Telephone Encounter (Signed)
PA for Rinvoq Tab 45 mg Er was approved through 05/20/2023. Approval letter will be scanned into the patient's chart.

## 2022-05-26 ENCOUNTER — Telehealth (INDEPENDENT_AMBULATORY_CARE_PROVIDER_SITE_OTHER): Payer: Self-pay | Admitting: *Deleted

## 2022-05-26 NOTE — Telephone Encounter (Signed)
Called [employer] [facility] to check status of rinvoq. Was told med went through insurance with $0 co pay and just need patient to call [phone] to confirm address and set up shipment. I called patient and gave him the phone number and asked him to call me back if he has any problems getting the medication and also to let me know when he receives rx so we know the start date because he will need to drop to 65m after 8 weeks.

## 2022-05-28 NOTE — Telephone Encounter (Signed)
Left message to return call to see if patient had set up shipment for med.

## 2022-06-07 NOTE — Telephone Encounter (Signed)
Called patient. He did receive rinvoq 55m and told me he started it about one and a half weeks ago. He know to take 477mfor 8 weeks then drop to 3055m

## 2022-06-16 NOTE — Telephone Encounter (Signed)
Will need to do authorization for 56m when patient gets refill on 45 mg

## 2022-06-23 ENCOUNTER — Ambulatory Visit: Payer: Medicaid Other | Admitting: Internal Medicine

## 2022-06-23 NOTE — Telephone Encounter (Signed)
I called [employer] and was told that patient has another policy besides [employer] and they have left him a message to call back but have not heard from him. They also called him while I was on phone with [employer] and did not answer. I also left patient a message to return my call to discuss with him.

## 2022-06-23 NOTE — Telephone Encounter (Signed)
Fax received from [employer] stating  We are unable to reach patient or locate active insurance coverage. Please provide active coverage information so we may process rinvoz er tab 71m. Please provide patient cureent phone number or front and back of insurance ID card so we may update our records.  I faxed copy of insurance card and gave patient's phone number [phone] I called [employer] to make sure they received fax and was told his insurance is active til July 15, 2022. Was going to discuss with patient today at his office visit but he no showed. Will call patient to discuss.

## 2022-06-25 NOTE — Telephone Encounter (Signed)
Left message to return call 

## 2022-06-29 NOTE — Telephone Encounter (Signed)
Left message to return call 

## 2022-07-01 NOTE — Telephone Encounter (Signed)
Patient did not return calls. I called [employer] and was told rinvoq was shipped out today.

## 2022-07-02 NOTE — Telephone Encounter (Signed)
Left message to return call 

## 2022-07-07 ENCOUNTER — Encounter (INDEPENDENT_AMBULATORY_CARE_PROVIDER_SITE_OTHER): Payer: Self-pay | Admitting: *Deleted

## 2022-07-07 NOTE — Telephone Encounter (Signed)
I called [employer] and recording says med was deliver to patient on 11/17. If he received med he needs to drop to 69m next month. I tried to call patient to discuss since he no showed last visit. Unable to get in touch with patient and letter was mailed asking him to call me.

## 2022-07-14 NOTE — Telephone Encounter (Signed)
Patient seen on 10/4 and started on rinvoq 62m for 8 weeks. He is due to drop to 361msoon. He no showed his appt on 11/8. And next appt scheduled for 2/7. I have been trying to call patient and mailed a letter for him to return call to see when he will finish [id number] we can get authorization for 43m34mo call back. Do you want me to go forward with authorization for 30 mg?

## 2022-07-15 NOTE — Telephone Encounter (Signed)
He needs a CBC and LFTs done soon as possible.  Okay to go ahead with the authorization for 30 mg.  Lets see if we get him in the office with either Dr. Jenetta Downer, [place], or myself in the next few weeks.  Thank you

## 2022-07-16 NOTE — Telephone Encounter (Signed)
Left message to return call to discuss with patient.

## 2022-07-16 NOTE — Telephone Encounter (Signed)
Called [facility] pharmacy and was transferred to [facility] in the prior [place] and she told me no Jerry Cordova was needed for 51m that it was an open auth and all strengths of rinvoq was approved for this patient and he should not have any issues when he drops to 384m

## 2022-07-21 ENCOUNTER — Other Ambulatory Visit: Payer: Self-pay | Admitting: *Deleted

## 2022-07-21 ENCOUNTER — Encounter: Payer: Self-pay | Admitting: *Deleted

## 2022-07-21 DIAGNOSIS — K51211 Ulcerative (chronic) proctitis with rectal bleeding: Secondary | ICD-10-CM

## 2022-07-21 NOTE — Telephone Encounter (Signed)
Unable to reach patient by phone. I mailed a letter asking patient to call office to move up his appointment and also attached lab orders and let patient know in letter he needs to do labs at [place] was placed in the mail.

## 2022-09-22 ENCOUNTER — Ambulatory Visit (INDEPENDENT_AMBULATORY_CARE_PROVIDER_SITE_OTHER): Payer: Medicaid Other | Admitting: Internal Medicine

## 2022-09-22 ENCOUNTER — Encounter: Payer: Self-pay | Admitting: Internal Medicine

## 2022-09-22 VITALS — BP 136/88 | HR 71 | Temp 97.9°F | Ht 73.0 in | Wt 143.2 lb

## 2022-09-22 DIAGNOSIS — Z79620 Long term (current) use of immunosuppressive biologic: Secondary | ICD-10-CM

## 2022-09-22 DIAGNOSIS — K5100 Ulcerative (chronic) pancolitis without complications: Secondary | ICD-10-CM

## 2022-09-22 NOTE — Patient Instructions (Signed)
I am happy to hear that you are doing well.  Continue on Rinvoq 30 mg daily.  We need to check blood work today down the hall at [facility].  We will call with results.  Follow-up in 3 months.  It was nice seeing you again today.  Dr. Abbey Chatters

## 2022-09-22 NOTE — Progress Notes (Signed)
Referring Provider: [facility] Primary Care Physician:  [facility] Primary GI:  Dr. Marletta Lor  Chief Complaint  Patient presents with   Ulcerative Colitis    Follow up on UC. Taking rinvoq 30mg . Reports doing well and no symptoms.     HPI:   Jerry Cordova is a 22 y.o. male who presents to clinic today for follow-up visit in regards to ulcerative colitis.  Patient has history of ulcerative pancolitis diagnosed in 2013. Previously followed by pediatric GI at [facility]  He was on Remicade at one point, but he developed antibodies and became symptomatic so he was transitioned to Humira with methotrexate.  Notably, he also had urticarial rashes with Remicade infusions and this was treated by antihistamines.  Last seen by [facility] in May 2020 and was on Humira 40 mg every other week with methotrexate 15mg  weekly and folate.  Symptoms were doing well at that time and he was advised to continue his current medication regimen.  Colonoscopy and EGD in 2019: Colonoscopy impression: Moderately active ulcerative colitis; pancolitis with some patulous in the rectum, biopsied.  The examined portion of the ileum was normal s/p biopsy.  TI biopsy was benign, cecal biopsy with quiescent chronic colitis, no active colitis, ascending colon biopsy benign, transverse colon biopsy benign, mild chronic active colitis in the descending and sigmoid colon.  Rectum with mild active colitis, no chronic colitis.   EGD: Normal exam.  Duodenal biopsies with no inflammation or villous blunting.  Gastric biopsies with mild patchy chronic gastritis.  H. pylori negative.  Esophageal biopsies benign.  Patient was seen in the emergency room 11/17/2020 for abdominal pain. He was prescribed Zofran and prednisone 20 mg twice daily x5 days then 1 daily for 5 days. Stool studies negative for infection.  Hepatitis B and varicella vaccines completed 02/24/2021.  I saw patient in December 2022.  At that  time he was chronically on budesonide and doing well.  We discussed treatment options for management of his UC and he decided to go back on Humira.  I ordered blood work to be performed to start that process.  Unfortunately patient never went to have blood work done.  He also never followed up with me.  States he was doing okay until May 2023 when he had worsening abdominal pain, rectal bleeding, diarrhea.  Presented to [facility], ER 12/25/2021.  CT showed pancolitis.  I was contacted by the ER.  I recommended stool studies and 8 week taper of prednisone.  Also recommended patient go have his blood work done and follow-up with me.  Followed up with me May 2023.  Stool studies not completed.  He did go have his blood work performed.  CRP 9.1, ESR 17, hepatitis B surface antibody positive, core antibody negative, surface antigen negative, QuantiFERON negative.  He was restarted on Humira.  Took this medication for 3 to 4 months without improvement in his symptoms.  Presented to [facility], ER 03/26/2022 with abdominal pain, rectal bleeding, diarrhea.  CT abdomen pelvis with contrast showed colon wall thickening in the ascending and transverse colon.  Given another prednisone taper.  Was supposed to see me early September but he had to reschedule.  Followed up 05/19/22. Discussed multiple options including Entyvio, Stelara, Rinvoq. He decided to pursue Rinvoq. Started on 45 mg daily x8 weeks, now on 30 mg daily. Did not have his blood work completed as ordered. Did not follow up as scheduled.  Today, states he is doing great.  Normal bowel movements.  No rectal bleeding.  No abdominal pain.  No mucus in his stools.  Tolerating Rinvoq without side effects. Very happy with his disease state.   Past Medical History:  Diagnosis Date   Anxiety    Is in therapy, not on medication   Chest wall injury 08/16/2008   Hit by baseball, radiography without fracture, discharged home   Chronic ulcerative colitis ([place])     Complication of anesthesia    seizures coming out of anesthesia once   Ulcerative colitis [facility])     Past Surgical History:  Procedure Laterality Date   BALLOON DILATION N/A 01/09/2021   Procedure: BALLOON DILATION;  Surgeon: Eloise Harman, DO;  [facility];  Service: Endoscopy;  Laterality: N/A;   BIOPSY  01/09/2021   Procedure: BIOPSY;  Surgeon: Eloise Harman, DO;  [facility];  Service: Endoscopy;;  GASTRIC, MID-ESOPHAGUS   COLONOSCOPY  12/02/2011    Surgeon: Oletha Blend, MD; entire colonic mucosa with marked friability, granularity, edema, and erythema with no skip lesions or rectal sparing.  Unable to negotiate through the ascending colon to directly visualize the cecum.  Multiple biopsies were taken and all revealed chronic active colitis consistent with IBD.   COLONOSCOPY  2019   [facility]- Moderately active UC, pancolitis with some patulous in the rectum.  Pathology: TI biopsy benign, cecal biopsy with quiescent chronic colitis, no active colitis, ascending and transverse colon biopsies benign, mild chronic active colitis in the descending and sigmoid colon, mild active colitis in the rectum.   ESOPHAGEAL BRUSHING  01/09/2021   Procedure: ESOPHAGEAL BRUSHING;  Surgeon: Eloise Harman, DO;  [facility];  Service: Endoscopy;;   ESOPHAGOGASTRODUODENOSCOPY  2019   [facility]; Normal exam.  Duodenal biopsies with no inflammation or villous blunting.  Gastric biopsies with mild patchy chronic gastritis.  H. pylori negative.  Esophageal biopsies benign.   ESOPHAGOGASTRODUODENOSCOPY (EGD) WITH PROPOFOL N/A 01/09/2021   Procedure: ESOPHAGOGASTRODUODENOSCOPY (EGD) WITH PROPOFOL;  Surgeon: Eloise Harman, DO;  [facility];  Service: Endoscopy;  Laterality: N/A;  2:45pm   FEMUR IM NAIL Left 04/05/2021   Procedure: INTRAMEDULLARY (IM) RETROGRADE FEMORAL NAILING;  Surgeon: Hiram Gash, MD;  Location: [place];  Service: Orthopedics;  Laterality: Left;    HARDWARE REMOVAL Left 10/16/2021   Procedure: HARDWARE REMOVAL DISTAL FEMUR;  Surgeon: Shona Needles, MD;  Location: [place];  Service: Orthopedics;  Laterality: Left;   I & D EXTREMITY Left 04/05/2021   Procedure: IRRIGATION AND DEBRIDEMENT LEFT LEG;  Surgeon: Hiram Gash, MD;  Location: [place];  Service: Orthopedics;  Laterality: Left;   ORIF HUMERUS FRACTURE Left 04/06/2021   Procedure: OPEN REDUCTION INTERNAL FIXATION (ORIF) DISTAL HUMERUS FRACTURE;  Surgeon: Shona Needles, MD;  Location: [place];  Service: Orthopedics;  Laterality: Left;   ORIF PELVIC FRACTURE N/A 04/06/2021   Procedure: OPEN REDUCTION INTERNAL FIXATION (ORIF) PELVIC FRACTURE;  Surgeon: Shona Needles, MD;  Location: [place];  Service: Orthopedics;  Laterality: N/A;    Current Outpatient Medications  Medication Sig Dispense Refill   Upadacitinib ER (RINVOQ) 30 MG TB24 Take one tablet daily 30 tablet 11   No current facility-administered medications for this visit.    Allergies as of 09/22/2022 - Review Complete 09/22/2022  Allergen Reaction Noted   Penicillins Hives and Rash 11/28/2011   Ivp dye [iodinated contrast media] Hives and Itching 09/21/2021   Penicillins  Hives 04/05/2021   Remicade [infliximab] Hives 12/22/2020    Family History  Problem Relation Age of Onset   Diabetes Other        Maternal family   Asthma Other        maternal family   Cancer Other        maternal family   Heart disease Other        Maternal GGF later in life    Ulcerative colitis Other        Unrelated step maternal GF    GI problems Cousin        Mother began asking paternal family about GI problems and learned of 2 young cousins with "bowel problems".    Colon cancer Neg Hx     Social History   Socioeconomic History   Marital status: Single    Spouse name: Not on file   Number of children: Not on file   Years of education: Not on file   Highest education level: Not on file  Occupational History   Not on file   Tobacco Use   Smoking status: Former    Types: E-cigarettes    Passive exposure: Past   Smokeless tobacco: Former    Types: Nurse, children's Use: Former  Substance and Sexual Activity   Alcohol use: Not Currently   Drug use: Not Currently   Sexual activity: Never  Other Topics Concern   Not on file  Social History Narrative   ** Merged History Encounter **       Social Determinants of Health   Financial Resource Strain: Not on file  Food Insecurity: Not on file  Transportation Needs: Not on file  Physical Activity: Not on file  Stress: Not on file  Social Connections: Not on file    Subjective: Review of Systems  Constitutional:  Negative for chills and fever.  HENT:  Negative for congestion and hearing loss.   Eyes:  Negative for blurred vision and double vision.  Respiratory:  Negative for cough and shortness of breath.   Cardiovascular:  Negative for chest pain and palpitations.  Gastrointestinal:  Positive for abdominal pain, blood in stool and diarrhea. Negative for constipation, heartburn, melena and vomiting.  Genitourinary:  Negative for dysuria and urgency.  Musculoskeletal:  Negative for joint pain and myalgias.  Skin:  Negative for itching and rash.  Neurological:  Negative for dizziness and headaches.  Psychiatric/Behavioral:  Negative for depression. The patient is not nervous/anxious.      Objective: BP 136/88 (BP Location: Left Arm, Patient Position: Sitting, Cuff Size: Normal)   Pulse 71   Temp 97.9 F (36.6 C) (Oral)   Ht 6\' 1"  (1.854 m)   Wt 143 lb 3.2 oz (65 kg)   BMI 18.89 kg/m  Physical Exam Constitutional:      Appearance: Normal appearance.  HENT:     Head: Normocephalic and atraumatic.  Eyes:     Extraocular Movements: Extraocular movements intact.     Conjunctiva/sclera: Conjunctivae normal.  Cardiovascular:     Rate and Rhythm: Normal rate and regular rhythm.  Pulmonary:     Effort: Pulmonary effort is normal.      Breath sounds: Normal breath sounds.  Abdominal:     General: Bowel sounds are normal.     Palpations: Abdomen is soft.  Musculoskeletal:        General: Normal range of motion.     Cervical back: Normal range of motion and neck  supple.  Skin:    General: Skin is warm.  Neurological:     General: No focal deficit present.     Mental Status: He is alert and oriented to person, place, and time.  Psychiatric:        Mood and Affect: Mood normal.        Behavior: Behavior normal.      Assessment: *Ulcerative pancolitis *Chronic immunomodulator therapy with Rinvoq  Plan: Discussed ulcerative colitis in depth with patient today.    Patient appears to be much improved clinically on Rinvoq.  Currently on 30 mg daily.  We will continue.  Counseled on the importance of adequate follow-up and completing blood work as scheduled.  Will check CBC, CMP, lipids today.  Hepatitis B and varicella vaccines completed 02/24/2021.   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   Counseled patient on the increased risk of cardiovascular effects, blood clot while taking this class of medications as well as side effects from decreased immune system and he understands.   Needs yearly skin checks.   Follow up in 3 months. Surveillance colonoscopy after 6-12 months of treatment.   09/22/2022 10:48 AM   Disclaimer: This note was dictated with voice recognition software. Similar sounding words can inadvertently be transcribed and may not be corrected upon review.

## 2022-09-22 NOTE — Addendum Note (Signed)
Addended by: Carmelina Noun on: 09/22/2022 11:09 AM   Modules accepted: Orders

## 2022-10-20 LAB — COMPREHENSIVE METABOLIC PANEL
AG Ratio: 1.3 (calc) (ref 1.0–2.5)
ALT: 13 U/L (ref 9–46)
AST: 21 U/L (ref 10–40)
Albumin: 4.4 g/dL (ref 3.6–5.1)
Alkaline phosphatase (APISO): 98 U/L (ref 36–130)
BUN: 9 mg/dL (ref 7–25)
CO2: 28 mmol/L (ref 20–32)
Calcium: 9.7 mg/dL (ref 8.6–10.3)
Chloride: 100 mmol/L (ref 98–110)
Creat: 0.97 mg/dL (ref 0.60–1.24)
Globulin: 3.5 g/dL (calc) (ref 1.9–3.7)
Glucose, Bld: 72 mg/dL (ref 65–99)
Potassium: 3.9 mmol/L (ref 3.5–5.3)
Sodium: 138 mmol/L (ref 135–146)
Total Bilirubin: 0.3 mg/dL (ref 0.2–1.2)
Total Protein: 7.9 g/dL (ref 6.1–8.1)

## 2022-10-20 LAB — CBC
HCT: 40.2 % (ref 38.5–50.0)
Hemoglobin: 13.6 g/dL (ref 13.2–17.1)
MCH: 30.4 pg (ref 27.0–33.0)
MCHC: 33.8 g/dL (ref 32.0–36.0)
MCV: 89.7 fL (ref 80.0–100.0)
MPV: 10.5 fL (ref 7.5–12.5)
Platelets: 429 10*3/uL — ABNORMAL HIGH (ref 140–400)
RBC: 4.48 10*6/uL (ref 4.20–5.80)
RDW: 14.8 % (ref 11.0–15.0)
WBC: 17.4 10*3/uL — ABNORMAL HIGH (ref 3.8–10.8)

## 2022-10-20 LAB — LIPID PANEL
Cholesterol: 100 mg/dL (ref <200)
HDL: 43 mg/dL (ref >=40)
LDL Cholesterol (Calc): 37 mg/dL (calc)
Non-HDL Cholesterol (Calc): 57 mg/dL (calc) (ref <130)
Total CHOL/HDL Ratio: 2.3 (calc) (ref <5.0)
Triglycerides: 110 mg/dL (ref <150)

## 2022-10-27 ENCOUNTER — Other Ambulatory Visit: Payer: Self-pay

## 2022-10-27 DIAGNOSIS — R1084 Generalized abdominal pain: Secondary | ICD-10-CM

## 2022-10-27 DIAGNOSIS — R197 Diarrhea, unspecified: Secondary | ICD-10-CM

## 2022-10-27 DIAGNOSIS — K50111 Crohn's disease of large intestine with rectal bleeding: Secondary | ICD-10-CM

## 2022-10-28 NOTE — Addendum Note (Signed)
Addended by: Orland Jarred on: 10/28/2022 08:17 AM   Modules accepted: Orders

## 2022-11-02 ENCOUNTER — Telehealth (INDEPENDENT_AMBULATORY_CARE_PROVIDER_SITE_OTHER): Payer: Self-pay | Admitting: *Deleted

## 2022-11-02 NOTE — Telephone Encounter (Signed)
Fax from [employer] stating several attempts to contact the patient to fill rinvoq er 30 mg have been made. Please notify us if there have been changes to this pt's therapy or updated contact number on file for this patient.   According to chart patient is still on rinvoq and I called the pt's number in chart. Unable to reach him.

## 2022-11-26 IMAGING — CT CT ABD-PELV W/O CM
2 of 4 series · 16 of 46 positions shown, 18 images · non-contrast
Comparison: CT 04/07/2021

CLINICAL DATA: Abdominal pain with dark bloody stool



[Series 2: axial st · axial · 0.69mm/px · z∈[-817,-412]mm · 13 of 93 slices shown, 15 images]
[im 6/93  soft-tissue]
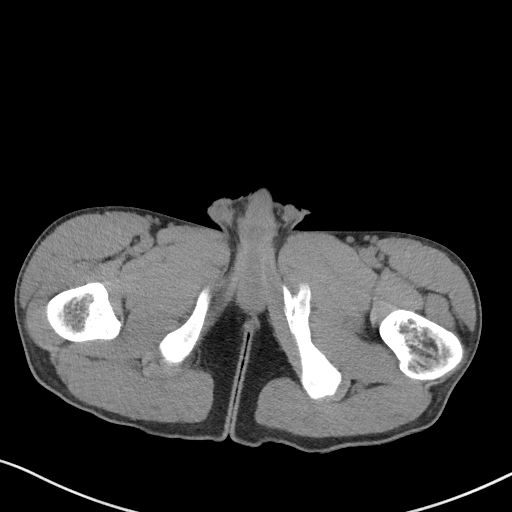
[im 6/93  bone]
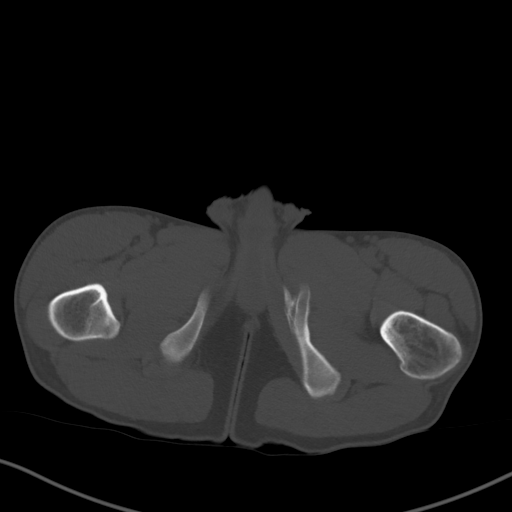
[im 11/93  soft-tissue]
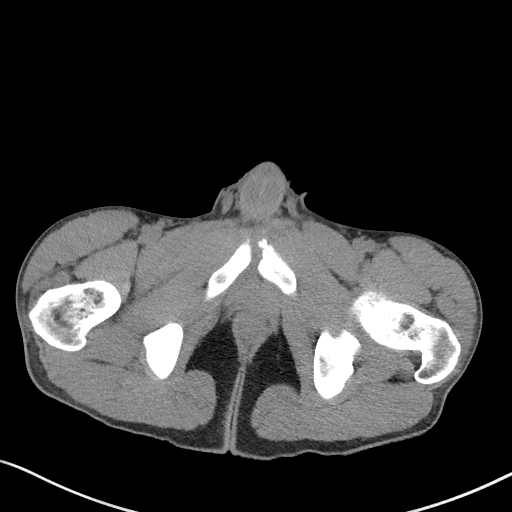
[im 22/93  soft-tissue]
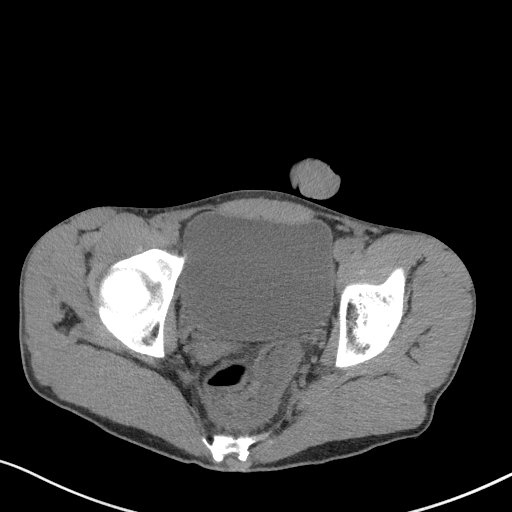
[im 28/93  soft-tissue]
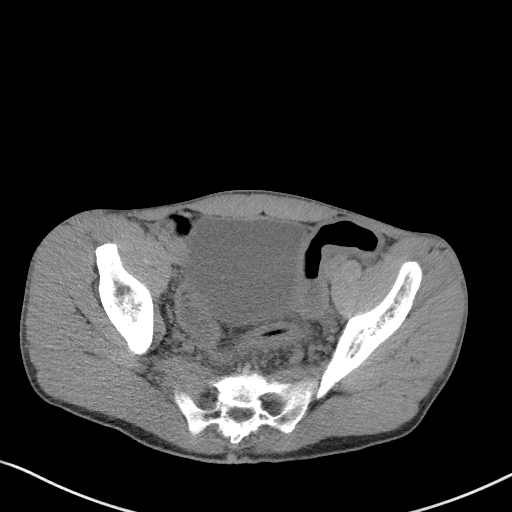
[im 33/93  soft-tissue]
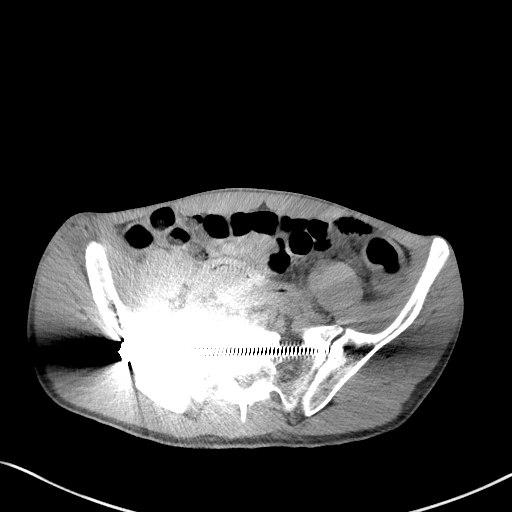
[im 38/93  soft-tissue]
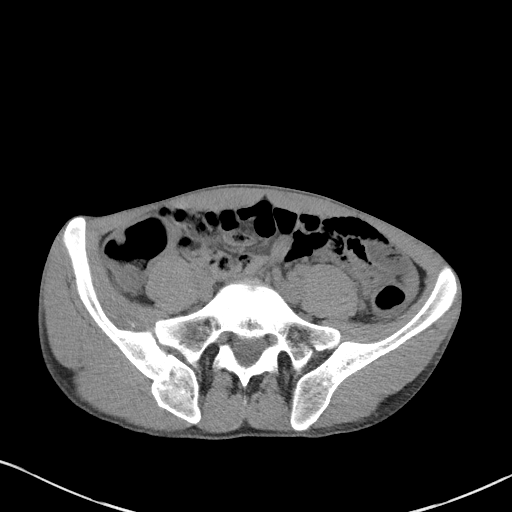
[im 49/93  soft-tissue]
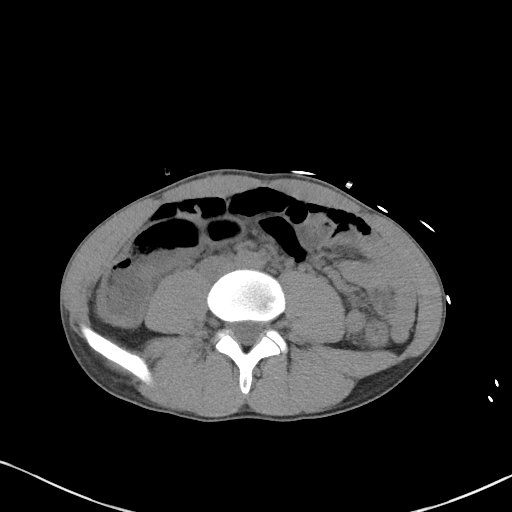
[im 55/93  soft-tissue]
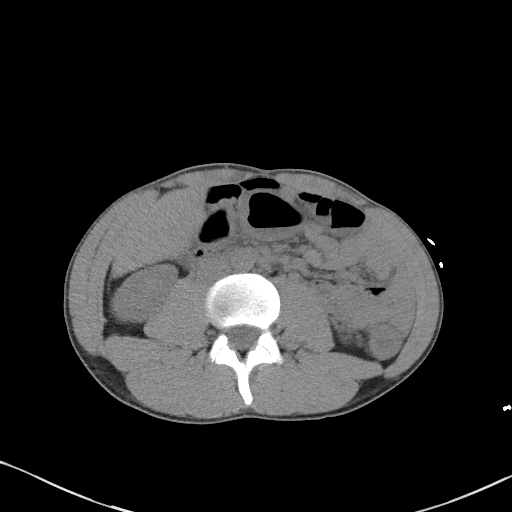
[im 60/93  soft-tissue]
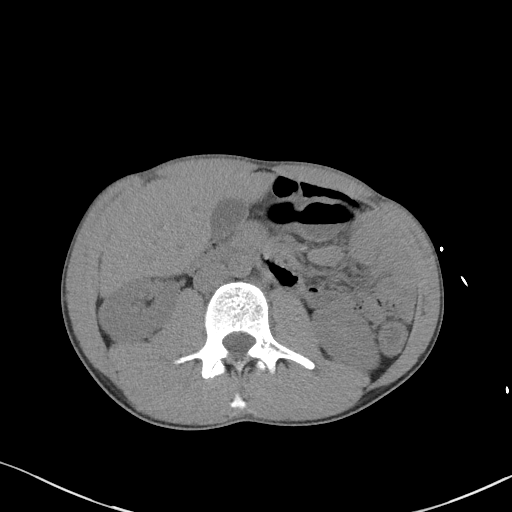
[im 60/93  bone]
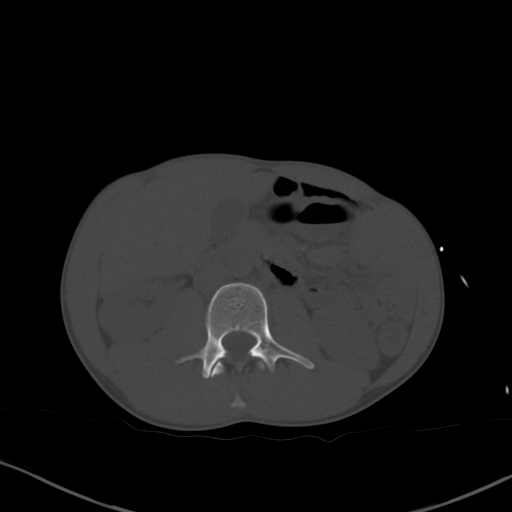
[im 65/93  soft-tissue]
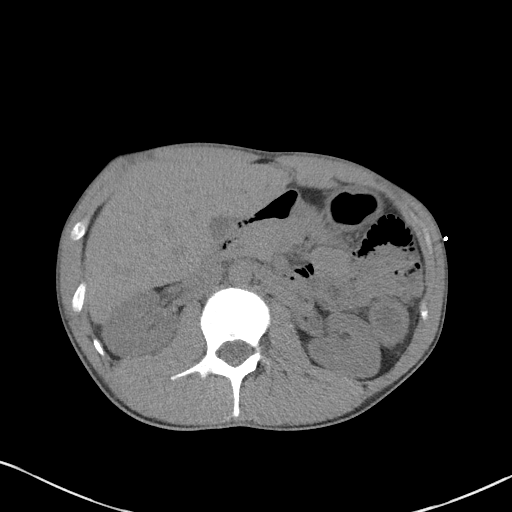
[im 71/93  soft-tissue]
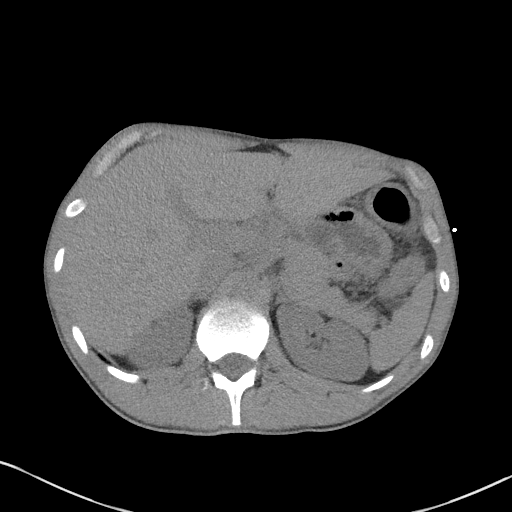
[im 82/93  soft-tissue]
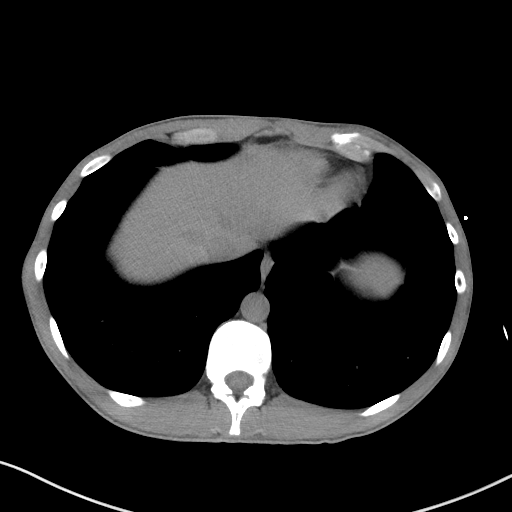
[im 87/93  soft-tissue]
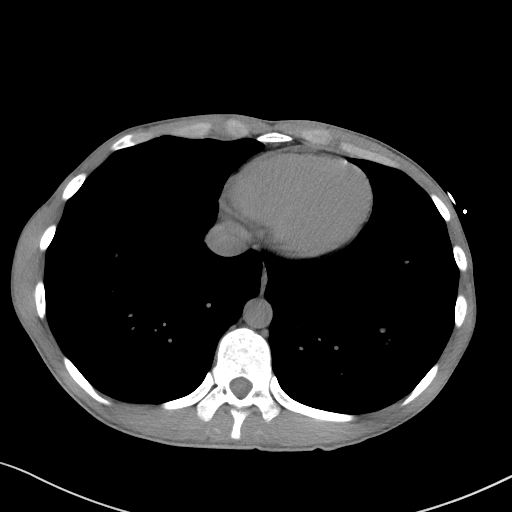

[Series 5: coronal st · coronal · 0.78mm/px · 3 of 83 slices shown]
[im 28/83  soft-tissue]
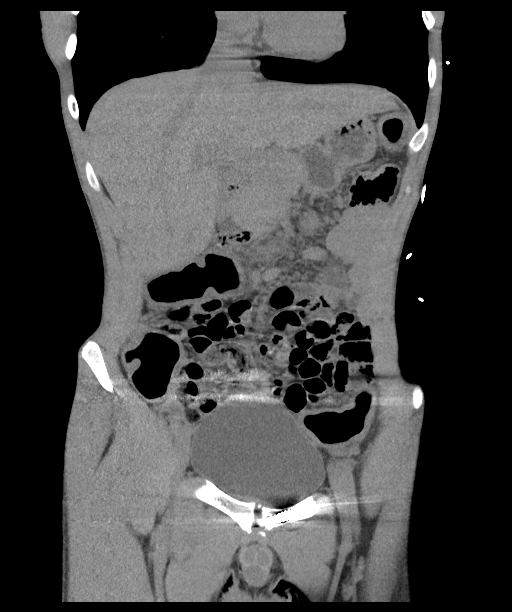
[im 37/83  soft-tissue]
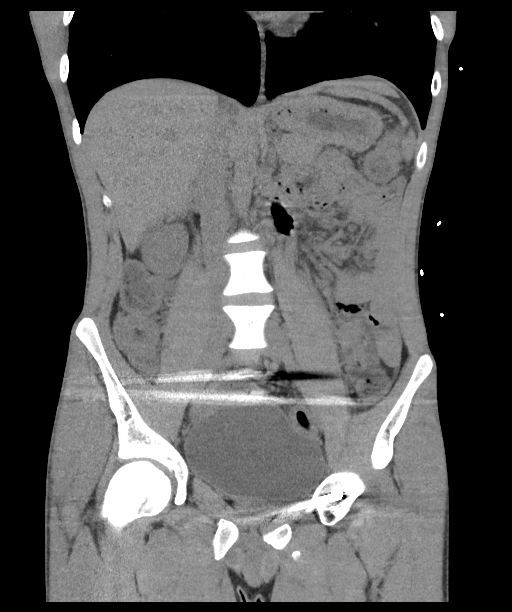
[im 46/83  soft-tissue]
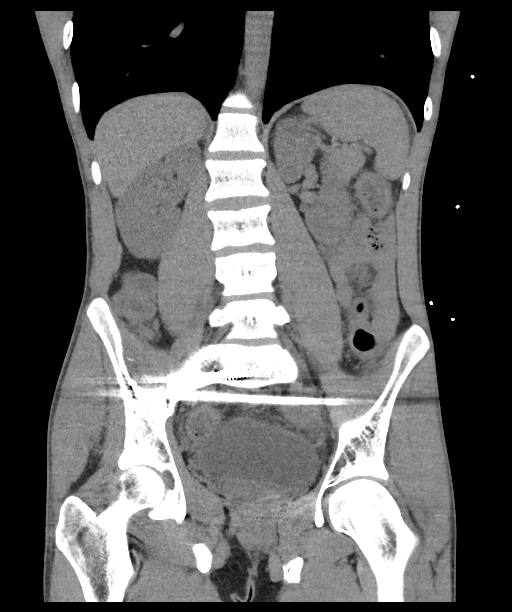

[16 of 46 positions shown; findings below may reference images not displayed]

FINDINGS: Lower chest: Lung bases demonstrate no acute consolidation or
effusion. Normal cardiac size

Hepatobiliary: No focal liver abnormality is seen. No gallstones,
gallbladder wall thickening, or biliary dilatation.

Pancreas: Unremarkable. No pancreatic ductal dilatation or
surrounding inflammatory changes.

Spleen: Normal in size without focal abnormality.

Adrenals/Urinary Tract: Adrenal glands are normal. Kidneys show no
hydronephrosis. Hyperdense renal pyramids without well-formed stone.
The bladder is unremarkable.

Stomach/Bowel: Stomach nonenlarged. No dilated small bowel. Diffuse
wall thickening involving the entire colon. The appendix is not well
seen. No intramural air.

Vascular/Lymphatic: No significant vascular findings are present. No
enlarged abdominal or pelvic lymph nodes.

Reproductive: Prostate is unremarkable.

Other: Negative for free air or free fluid

Musculoskeletal: Fixating screws within the left superior pubic
ramus and across the right SI joint. Old pelvic fracture deformity
and left inferior pubic ramus fracture deformity.
IMPRESSION: 1. Wall thickening involving the entire colon, consistent with
pancolitis and felt consistent with the history of ulcerative
colitis. No intramural air or perforation.
2. Slightly hyperdense renal pyramids, no well-formed stone,
question a degree of nephrocalcinosis

## 2022-12-01 LAB — COMPREHENSIVE METABOLIC PANEL
AG Ratio: 1.2 (calc) (ref 1.0–2.5)
ALT: 14 U/L (ref 9–46)
AST: 20 U/L (ref 10–40)
Albumin: 4.6 g/dL (ref 3.6–5.1)
Alkaline phosphatase (APISO): 85 U/L (ref 36–130)
BUN: 15 mg/dL (ref 7–25)
CO2: 32 mmol/L (ref 20–32)
Calcium: 9.8 mg/dL (ref 8.6–10.3)
Chloride: 98 mmol/L (ref 98–110)
Creat: 0.95 mg/dL (ref 0.60–1.24)
Globulin: 3.9 g/dL (calc) — ABNORMAL HIGH (ref 1.9–3.7)
Glucose, Bld: 84 mg/dL (ref 65–139)
Potassium: 4.4 mmol/L (ref 3.5–5.3)
Sodium: 137 mmol/L (ref 135–146)
Total Bilirubin: 0.3 mg/dL (ref 0.2–1.2)
Total Protein: 8.5 g/dL — ABNORMAL HIGH (ref 6.1–8.1)

## 2022-12-01 LAB — CBC WITH DIFFERENTIAL/PLATELET
Absolute Monocytes: 710 cells/uL (ref 200–950)
Basophils Absolute: 42 cells/uL (ref 0–200)
Basophils Relative: 0.4 %
Eosinophils Absolute: 530 cells/uL — ABNORMAL HIGH (ref 15–500)
Eosinophils Relative: 5 %
HCT: 41.4 % (ref 38.5–50.0)
Hemoglobin: 13.6 g/dL (ref 13.2–17.1)
Lymphs Abs: 5003 cells/uL — ABNORMAL HIGH (ref 850–3900)
MCH: 30.2 pg (ref 27.0–33.0)
MCHC: 32.9 g/dL (ref 32.0–36.0)
MCV: 92 fL (ref 80.0–100.0)
MPV: 10.3 fL (ref 7.5–12.5)
Monocytes Relative: 6.7 %
Neutro Abs: 4314 cells/uL (ref 1500–7800)
Neutrophils Relative %: 40.7 %
Platelets: 520 10*3/uL — ABNORMAL HIGH (ref 140–400)
RBC: 4.5 10*6/uL (ref 4.20–5.80)
RDW: 14.2 % (ref 11.0–15.0)
Total Lymphocyte: 47.2 %
WBC: 10.6 10*3/uL (ref 3.8–10.8)

## 2022-12-01 LAB — SEDIMENTATION RATE: Sed Rate: 22 mm/h — ABNORMAL HIGH (ref 0–15)

## 2022-12-01 LAB — LIPASE: Lipase: 25 U/L (ref 7–60)

## 2022-12-01 LAB — C-REACTIVE PROTEIN: CRP: <3 mg/L (ref <8.0)

## 2022-12-08 ENCOUNTER — Telehealth: Payer: Self-pay

## 2022-12-08 NOTE — Telephone Encounter (Signed)
Pt phoned wanting the results of his lab work. Please advise

## 2022-12-22 ENCOUNTER — Ambulatory Visit: Payer: Medicaid Other | Admitting: Internal Medicine

## 2022-12-22 ENCOUNTER — Telehealth: Payer: Self-pay

## 2022-12-22 NOTE — Telephone Encounter (Signed)
Pt called and stated he could not make his appt but wants the result of his labs. Please advise

## 2022-12-23 NOTE — Telephone Encounter (Signed)
Pt calling again for his test results. Pt had a appt yesterday but did not come

## 2022-12-29 ENCOUNTER — Encounter: Payer: Self-pay | Admitting: Internal Medicine

## 2022-12-29 ENCOUNTER — Ambulatory Visit (INDEPENDENT_AMBULATORY_CARE_PROVIDER_SITE_OTHER): Payer: Medicaid Other | Admitting: Internal Medicine

## 2022-12-29 VITALS — BP 136/86 | HR 66 | Temp 98.1°F | Ht 73.0 in | Wt 143.9 lb

## 2022-12-29 DIAGNOSIS — L700 Acne vulgaris: Secondary | ICD-10-CM | POA: Diagnosis not present

## 2022-12-29 DIAGNOSIS — K5100 Ulcerative (chronic) pancolitis without complications: Secondary | ICD-10-CM | POA: Diagnosis not present

## 2022-12-29 DIAGNOSIS — Z79620 Long term (current) use of immunosuppressive biologic: Secondary | ICD-10-CM | POA: Diagnosis not present

## 2022-12-29 MED ORDER — ADAPALENE 0.1 % EX CREA: TOPICAL_CREAM | Freq: Every day | CUTANEOUS | 5 refills | Status: DC

## 2022-12-29 NOTE — Patient Instructions (Signed)
I am happy to hear that you are doing well.  Continue on Rinvoq.  I will send in topical medication to use nightly for your acne.  Avoid excessive sun exposure while on this medication.  Acne may appear worse in the first 1 to 2 weeks though should steadily improve thereafter.  Follow-up in 3 to 4 months.  Will schedule colonoscopy at that time.  It was nice seeing you again today.  Dr. Marletta Lor

## 2022-12-29 NOTE — Progress Notes (Signed)
Referring Provider: [facility] Primary Care Physician:  [facility] Primary GI:  Dr. Marletta Lor  Chief Complaint  Patient presents with   Ulcerative Colitis    Follow up on uc. Taking rinvoq 30mg  daily. Wonders if med is causing acne on his back. Reports GI are good on rinvoq. Does have some constipation at times. Has to strain at times to have BM. Does not take anything.     HPI:   Jerry Cordova is a 22 y.o. male who presents to clinic today for follow-up visit in regards to ulcerative colitis.  Patient has history of ulcerative pancolitis diagnosed in 2013. Previously followed by pediatric GI at [facility].  He was on Remicade at one point, but he developed antibodies and became symptomatic so he was transitioned to Humira with methotrexate.  Notably, he also had urticarial rashes with Remicade infusions and this was treated by antihistamines.  Last seen by [facility] in May 2020 and was on Humira 40 mg every other week with methotrexate 15mg  weekly and folate.  Symptoms were doing well at that time and he was advised to continue his current medication regimen.  Colonoscopy and EGD in 2019: Colonoscopy impression: Moderately active ulcerative colitis; pancolitis with some patulous in the rectum, biopsied.  The examined portion of the ileum was normal s/p biopsy.  TI biopsy was benign, cecal biopsy with quiescent chronic colitis, no active colitis, ascending colon biopsy benign, transverse colon biopsy benign, mild chronic active colitis in the descending and sigmoid colon.  Rectum with mild active colitis, no chronic colitis.   EGD: Normal exam.  Duodenal biopsies with no inflammation or villous blunting.  Gastric biopsies with mild patchy chronic gastritis.  H. pylori negative.  Esophageal biopsies benign.  Patient was seen in the emergency room 11/17/2020 for abdominal pain. He was prescribed Zofran and prednisone 20 mg twice daily x5 days then 1 daily for 5  days. Stool studies negative for infection.  Hepatitis B and varicella vaccines completed 02/24/2021.  I saw patient in December 2022.  At that time he was chronically on budesonide and doing well.  We discussed treatment options for management of his UC and he decided to go back on Humira.  I ordered blood work to be performed to start that process.  Unfortunately patient never went to have blood work done.  He also never followed up with me.  States he was doing okay until May 2023 when he had worsening abdominal pain, rectal bleeding, diarrhea.  Presented to [facility], ER 12/25/2021.  CT showed pancolitis.  I was contacted by the ER.  I recommended stool studies and 8 week taper of prednisone.  Also recommended patient go have his blood work done and follow-up with me.  Followed up with me May 2023.  Stool studies not completed.  He did go have his blood work performed.  CRP 9.1, ESR 17, hepatitis B surface antibody positive, core antibody negative, surface antigen negative, QuantiFERON negative.  He was restarted on Humira.  Took this medication for 3 to 4 months without improvement in his symptoms.  Presented to [facility], ER 03/26/2022 with abdominal pain, rectal bleeding, diarrhea.  CT abdomen pelvis with contrast showed colon wall thickening in the ascending and transverse colon.  Given another prednisone taper.  Was supposed to see me early September but he had to reschedule.  Followed up 05/19/22. Discussed multiple options including Entyvio, Stelara, Rinvoq. He decided to pursue Rinvoq. Started  on 45 mg daily x8 weeks, now on 30 mg daily.   Today, states he is doing great.  Normal bowel movements.  No rectal bleeding.  No abdominal pain.  No mucus in his stools.  Very happy with his disease state.   Does note that he has had worsening acne on his back since starting the Rinvoq  Recent blood work 11/30/2022: WBC WNL though Lymphs and eosinophils elevated, CRP normal, ESR is slightly  elevated at 22, lipase normal, CMP normal.  Past Medical History:  Diagnosis Date   Anxiety    Is in therapy, not on medication   Chest wall injury 08/16/2008   Hit by baseball, radiography without fracture, discharged home   Chronic ulcerative colitis (HCC)    Complication of anesthesia    seizures coming out of anesthesia once   Ulcerative colitis [facility])     Past Surgical History:  Procedure Laterality Date   BALLOON DILATION N/A 01/09/2021   Procedure: BALLOON DILATION;  Surgeon: Lanelle Bal, DO;  Location: [facility];  Service: Endoscopy;  Laterality: N/A;   BIOPSY  01/09/2021   Procedure: BIOPSY;  Surgeon: Lanelle Bal, DO;  Location: [facility];  Service: Endoscopy;;  GASTRIC, MID-ESOPHAGUS   COLONOSCOPY  12/02/2011    Surgeon: Jon Gills, MD; entire colonic mucosa with marked friability, granularity, edema, and erythema with no skip lesions or rectal sparing.  Unable to negotiate through the ascending colon to directly visualize the cecum.  Multiple biopsies were taken and all revealed chronic active colitis consistent with IBD.   COLONOSCOPY  2019   [facility]- Moderately active UC, pancolitis with some patulous in the rectum.  Pathology: TI biopsy benign, cecal biopsy with quiescent chronic colitis, no active colitis, ascending and transverse colon biopsies benign, mild chronic active colitis in the descending and sigmoid colon, mild active colitis in the rectum.   ESOPHAGEAL BRUSHING  01/09/2021   Procedure: ESOPHAGEAL BRUSHING;  Surgeon: Lanelle Bal, DO;  [facility];  Service: Endoscopy;;   ESOPHAGOGASTRODUODENOSCOPY  2019   [facility]; Normal exam.  Duodenal biopsies with no inflammation or villous blunting.  Gastric biopsies with mild patchy chronic gastritis.  H. pylori negative.  Esophageal biopsies benign.   ESOPHAGOGASTRODUODENOSCOPY (EGD) WITH PROPOFOL N/A 01/09/2021   Procedure: ESOPHAGOGASTRODUODENOSCOPY (EGD) WITH PROPOFOL;  Surgeon: Lanelle Bal, DO;  [facility];  Service: Endoscopy;  Laterality: N/A;  2:45pm   FEMUR IM NAIL Left 04/05/2021   Procedure: INTRAMEDULLARY (IM) RETROGRADE FEMORAL NAILING;  Surgeon: Bjorn Pippin, MD;  Location: [facility] OR;  Service: Orthopedics;  Laterality: Left;   HARDWARE REMOVAL Left 10/16/2021   Procedure: HARDWARE REMOVAL DISTAL FEMUR;  Surgeon: Roby Lofts, MD;  Location: [facility] OR;  Service: Orthopedics;  Laterality: Left;   I & D EXTREMITY Left 04/05/2021   Procedure: IRRIGATION AND DEBRIDEMENT LEFT LEG;  Surgeon: Bjorn Pippin, MD;  Location: [facility] OR;  Service: Orthopedics;  Laterality: Left;   ORIF HUMERUS FRACTURE Left 04/06/2021   Procedure: OPEN REDUCTION INTERNAL FIXATION (ORIF) DISTAL HUMERUS FRACTURE;  Surgeon: Roby Lofts, MD;  Location: [facility] OR;  Service: Orthopedics;  Laterality: Left;   ORIF PELVIC FRACTURE N/A 04/06/2021   Procedure: OPEN REDUCTION INTERNAL FIXATION (ORIF) PELVIC FRACTURE;  Surgeon: Roby Lofts, MD;  Location: [facility] OR;  Service: Orthopedics;  Laterality: N/A;    Current Outpatient Medications  Medication Sig Dispense Refill   adapalene (DIFFERIN) 0.1 % cream Apply topically at bedtime. Avoid  excessive sun exposure 45 g 5   escitalopram (LEXAPRO) 20 MG tablet Take 20 mg by mouth daily.     Upadacitinib ER (RINVOQ) 30 MG TB24 Take one tablet daily 30 tablet 11   No current facility-administered medications for this visit.    Allergies as of 12/29/2022 - Review Complete 12/29/2022  Allergen Reaction Noted   Penicillins Hives and Rash 11/28/2011   Ivp dye [iodinated contrast media] Hives and Itching 09/21/2021   Penicillins Hives 04/05/2021   Remicade [infliximab] Hives 12/22/2020    Family History  Problem Relation Age of Onset   Diabetes Other        Maternal family   Asthma Other        maternal family   Cancer Other        maternal family   Heart disease Other        Maternal GGF later in life    Ulcerative colitis Other         Unrelated step maternal GF    GI problems Cousin        Mother began asking paternal family about GI problems and learned of 2 young cousins with "bowel problems".    Colon cancer Neg Hx     Social History   Socioeconomic History   Marital status: Single    Spouse name: Not on file   Number of children: Not on file   Years of education: Not on file   Highest education level: Not on file  Occupational History   Not on file  Tobacco Use   Smoking status: Former    Types: E-cigarettes    Passive exposure: Past   Smokeless tobacco: Former    Types: Associate Professor Use: Former  Substance and Sexual Activity   Alcohol use: Not Currently   Drug use: Not Currently   Sexual activity: Never  Other Topics Concern   Not on file  Social History Narrative   ** Merged History Encounter **       Social Determinants of Health   Financial Resource Strain: Not on file  Food Insecurity: Not on file  Transportation Needs: Not on file  Physical Activity: Not on file  Stress: Not on file  Social Connections: Not on file    Subjective: Review of Systems  Constitutional:  Negative for chills and fever.  HENT:  Negative for congestion and hearing loss.   Eyes:  Negative for blurred vision and double vision.  Respiratory:  Negative for cough and shortness of breath.   Cardiovascular:  Negative for chest pain and palpitations.  Gastrointestinal:  Negative for abdominal pain, blood in stool, constipation, diarrhea, heartburn, melena and vomiting.  Genitourinary:  Negative for dysuria and urgency.  Musculoskeletal:  Negative for joint pain and myalgias.  Skin:  Negative for itching and rash.  Neurological:  Negative for dizziness and headaches.  Psychiatric/Behavioral:  Negative for depression. The patient is not nervous/anxious.      Objective: BP 136/86 (BP Location: Left Arm, Patient Position: Sitting, Cuff Size: Normal)   Pulse 66   Temp 98.1 F (36.7 C) (Oral)   Ht  6\' 1"  (1.854 m)   Wt 143 lb 14.4 oz (65.3 kg)   BMI 18.99 kg/m  Physical Exam Constitutional:      Appearance: Normal appearance.  HENT:     Head: Normocephalic and atraumatic.  Eyes:     Extraocular Movements: Extraocular movements intact.     Conjunctiva/sclera: Conjunctivae normal.  Cardiovascular:     Rate and Rhythm: Normal rate and regular rhythm.  Pulmonary:     Effort: Pulmonary effort is normal.     Breath sounds: Normal breath sounds.  Abdominal:     General: Bowel sounds are normal.     Palpations: Abdomen is soft.  Musculoskeletal:        General: Normal range of motion.     Cervical back: Normal range of motion and neck supple.  Skin:    General: Skin is warm.  Neurological:     General: No focal deficit present.     Mental Status: He is alert and oriented to person, place, and time.  Psychiatric:        Mood and Affect: Mood normal.        Behavior: Behavior normal.      Assessment: *Ulcerative pancolitis *Chronic immunomodulator therapy with Rinvoq *Acne vulgaris  Plan: Discussed ulcerative colitis in depth with patient today.    Patient appears to be much improved clinically on Rinvoq.  Currently on 30 mg daily.  We will continue.  Counseled on the importance of adequate follow-up and completing blood work as scheduled.  Recent blood work relatively unremarkable low lymphocytes and eosinophils elevated.  Patient states he had COVID at the time of blood work.  We will recheck today.  Hepatitis B and varicella vaccines completed 02/24/2021.   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   Counseled patient on the increased risk of cardiovascular effects, blood clot while taking this class of medications as well as side effects from decreased immune system and he understands.   Needs yearly skin checks.   I will send in topical adapalene for worsening acne. May need Derm referral.  Follow up in 3-4 months.  Will schedule surveillance  colonoscopy at that time.  12/29/2022 8:38 AM   Disclaimer: This note was dictated with voice recognition software. Similar sounding words can inadvertently be transcribed and may not be corrected upon review.

## 2023-04-22 ENCOUNTER — Telehealth: Payer: Self-pay

## 2023-04-22 NOTE — Telephone Encounter (Signed)
Documentation from [employer] for [place] for Rinvoq 45mg . I looked at last ov from 12/29/2022 and pt is on 30mg . I didn't see any notes from [place] either. Is the pt suppose to be on 30mg   or 45mg . Also there is no prescription. Please advise

## 2023-04-26 NOTE — Telephone Encounter (Signed)
Pt returned the call and advised he was on 30 mg so I deleted the one for 45 mg in [employer]

## 2023-04-26 NOTE — Telephone Encounter (Signed)
Phoned and LMOVM for the pt to call or MyChart me whether he is on 30 mg or 45 mg of Rinvoq

## 2023-04-26 NOTE — Telephone Encounter (Signed)
He should be down to 30 mg daily now

## 2023-05-04 ENCOUNTER — Telehealth: Payer: Self-pay

## 2023-05-04 NOTE — Telephone Encounter (Signed)
FYI: Dr Marletta Lor  We got documentation from [employer] regarding the pt's Rinvoq that they had tried to call him several times to have this medication filled. I sent the pt a MyChart message regarding the message. Waiting on him to contact me back.

## 2023-05-04 NOTE — Telephone Encounter (Signed)
Noted  

## 2023-05-05 ENCOUNTER — Encounter: Payer: Self-pay | Admitting: Internal Medicine

## 2023-05-05 ENCOUNTER — Ambulatory Visit: Payer: Medicaid Other | Admitting: Internal Medicine

## 2023-07-15 ENCOUNTER — Other Ambulatory Visit: Payer: Self-pay | Admitting: Internal Medicine

## 2023-07-18 ENCOUNTER — Encounter: Payer: Self-pay | Admitting: Internal Medicine

## 2023-07-19 ENCOUNTER — Telehealth: Payer: Self-pay | Admitting: Internal Medicine

## 2023-07-19 NOTE — Telephone Encounter (Signed)
Patient called and said he had been without the Rinvoq for awhile.  He said he was having a flare up and asked if he could get a note for work even tho he hasn't seen a dr.

## 2023-07-19 NOTE — Telephone Encounter (Signed)
Pt was advised by me and [employer] in September and the pt did not do as instructed. Now the pt is needing a new Rx to be sent in because he is having a flair up. Pt is not following instructions or trying to get in contact until he has a flair up. Pt is asking can we speed up the process and get him some help. Please advise

## 2023-07-19 NOTE — Telephone Encounter (Signed)
Phoned the pt and LMOVM for the pt to return call 

## 2023-07-21 ENCOUNTER — Telehealth: Payer: Self-pay | Admitting: Internal Medicine

## 2023-07-21 NOTE — Telephone Encounter (Signed)
Patient called back today and he stated that he didn't have a missed call or a message from you and would like for you to please call him back.

## 2023-07-22 NOTE — Telephone Encounter (Signed)
See other note

## 2023-07-22 NOTE — Telephone Encounter (Signed)
Pt has called back leaving several messages regarding a refill on his Rinvoq. The company had tried calling the pt and he never returned their call. I don't know if he needs to start over or what but please advise or call pt. Pt needs to follow instructions regarding when the company calls him regarding the next shipment of his medication.

## 2023-07-25 ENCOUNTER — Telehealth: Payer: Self-pay

## 2023-07-25 ENCOUNTER — Telehealth: Payer: Self-pay | Admitting: Internal Medicine

## 2023-07-25 NOTE — Telephone Encounter (Signed)
Patient called again and says that all he needs is for Dr. Marletta Lor to write a script for the Linvoq.  I did talk to him about the messages that Dena made in the chart.  He says he just wants to speak to someone today.

## 2023-07-25 NOTE — Telephone Encounter (Signed)
Returned the pt's call once again and no answer/vm set up could not leave a message. Dr Marletta Lor has been sent notes regarding this pt

## 2023-07-25 NOTE — Telephone Encounter (Signed)
Pt has sent you a MyChart message on 07/18/2023. He is calling every other day about his Rinvoq. Now today he LMOVM advising he wanted to talk to you and he is starting to have BM's with blood in it but its not enough to go get checked out. Please advise

## 2023-07-26 NOTE — Telephone Encounter (Signed)
The pt left another vm for you to call him as well as the mother but she is not listed on his DPR. I do know he is on Prednisone right now from a Urgent Care. I tried to call him and when you do it goes to vm. It doesn't even ring. Please try and contact this pt

## 2023-07-26 NOTE — Telephone Encounter (Signed)
Returned the pt's call once again no ans. Also his mother has called but she is not on his DPR to speak with. Pt is on Prednisone

## 2023-07-28 ENCOUNTER — Other Ambulatory Visit: Payer: Self-pay

## 2023-07-28 NOTE — Progress Notes (Signed)
Taking care of on a different encounter.

## 2023-07-28 NOTE — Telephone Encounter (Signed)
Spoke with pt and scheduled a virtual visit with Dr. Marletta Lor on 08/03/2023 per Dr. Queen Blossom request. Sent in two refills of medication in the meantime. Advised pt that he must keep this appt for further refills, pt verbalized understanding.

## 2023-07-28 NOTE — Telephone Encounter (Signed)
Spoke to pt and informed him that two refills would be sent in to get him to his virtual appt with Dr. Marletta Lor on 09/08/2023 and that he would need to keep this appt for further refills. Pt verbalized understanding.

## 2023-08-03 ENCOUNTER — Telehealth: Payer: Medicaid Other | Admitting: Internal Medicine

## 2023-08-03 DIAGNOSIS — L700 Acne vulgaris: Secondary | ICD-10-CM | POA: Diagnosis not present

## 2023-08-03 DIAGNOSIS — K5100 Ulcerative (chronic) pancolitis without complications: Secondary | ICD-10-CM | POA: Diagnosis not present

## 2023-08-03 DIAGNOSIS — Z79620 Long term (current) use of immunosuppressive biologic: Secondary | ICD-10-CM | POA: Diagnosis not present

## 2023-08-03 NOTE — Progress Notes (Signed)
Primary Care Physician:  [facility]   Patient Location: Home   Provider Location: [facility] office   Reason for Visit: Ulcerative colitis   Total time (minutes) spent on medical discussion: >21 minutes   Visit was conducted using virtual method.  Visit was requested by patient.  Virtual Visit via MyChart Video Note I connected with Jerry Cordova on 08/03/23 at 10:50 AM EST by video and verified that I am speaking with the correct person using two identifiers.   I discussed the limitations, risks, security and privacy concerns of performing an evaluation and management service by video and the availability of in person appointments. I also discussed with the patient that there may be a patient responsible charge related to this service. The patient expressed understanding and agreed to proceed.  Chief Complaint  Patient presents with   ulcerative pancolitis    Patient doing a video visit today for a Follow up on ulcerative pancolitis. Patient states he has been off of rinvoq 30mg  for a couple of months but has contacted pharmacy and they are in process of shipping.      History of Present Illness: Patient has history of ulcerative pancolitis diagnosed in 2013. Previously followed by pediatric GI at [facility].  He was on Remicade at one point, but he developed antibodies and became symptomatic so he was transitioned to Humira with methotrexate.  Notably, he also had urticarial rashes with Remicade infusions and this was treated by antihistamines.  Last seen by [facility] in May 2020 and was on Humira 40 mg every other week with methotrexate 15mg  weekly and folate.  Symptoms were doing well at that time and he was advised to continue his current medication regimen.  Colonoscopy and EGD in 2019: Colonoscopy impression: Moderately active ulcerative colitis; pancolitis with some patulous in the rectum, biopsied.  The examined portion of the ileum was normal s/p biopsy.  TI  biopsy was benign, cecal biopsy with quiescent chronic colitis, no active colitis, ascending colon biopsy benign, transverse colon biopsy benign, mild chronic active colitis in the descending and sigmoid colon.  Rectum with mild active colitis, no chronic colitis.   EGD: Normal exam.  Duodenal biopsies with no inflammation or villous blunting.  Gastric biopsies with mild patchy chronic gastritis.  H. pylori negative.  Esophageal biopsies benign.  Hepatitis B and varicella vaccines completed 02/24/2021.  December 2022.  I saw patient in office, at that time he was chronically on budesonide and doing well.  We discussed treatment options for management of his UC and he decided to go back on Humira.  I ordered blood work to be performed to start that process.  Unfortunately patient never went to have blood work done.  He also never followed up with me.  Presented to [facility], ER 12/25/2021 for worsening abdominal pain, rectal bleeding, diarrhea..  CT showed pancolitis.  I was contacted by the ER.  I recommended stool studies and 8 week taper of prednisone.  Also recommended patient go have his blood work done and follow-up with me.  Followed up with me May 2023.  Stool studies not completed.  He did go have his blood work performed.  CRP 9.1, ESR 17, hepatitis B surface antibody positive, core antibody negative, surface antigen negative, QuantiFERON negative.  He was restarted on Humira.  Took this medication for 3 to 4 months without improvement in his symptoms.  Presented to [facility], ER 03/26/2022 with abdominal pain, rectal bleeding, diarrhea.  CT abdomen pelvis with contrast showed colon wall thickening in the ascending and transverse colon.  Given another prednisone taper.  Followed up 05/19/22. Discussed multiple options including Entyvio, Stelara, Rinvoq. He decided to pursue Rinvoq. Started on 45 mg daily x8 weeks, now on 30 mg daily.  Rather quickly got his UC under good control.  Symptoms vastly  improved.  Did develop acne on his back from the Rinvoq, treated with topical adapalene and this has resolved.  Blood work 11/30/2022: WBC WNL though Lymphs and eosinophils elevated, CRP normal, ESR is slightly elevated at 22, lipase normal, CMP normal.  Advised to repeat CBC though this never got done.  Recently moved to [facility].  Virtual visit today.  States he has not taking Rinvoq in 2-3 months.  States this is entirely his follow-up as he did not set up refills with specialty pharmacy.  He would like to go back on this however.  Notes a flare in his symptoms.  Mild, occasional rectal bleeding, mild amount of abdominal pain.  Past Medical History:  Diagnosis Date   Anxiety    Is in therapy, not on medication   Chest wall injury 08/16/2008   Hit by baseball, radiography without fracture, discharged home   Chronic ulcerative colitis (HCC)    Complication of anesthesia    seizures coming out of anesthesia once   Ulcerative colitis [facility])      Past Surgical History:  Procedure Laterality Date   BALLOON DILATION N/A 01/09/2021   Procedure: BALLOON DILATION;  Surgeon: Lanelle Bal, DO;  Location: [facility];  Service: Endoscopy;  Laterality: N/A;   BIOPSY  01/09/2021   Procedure: BIOPSY;  Surgeon: Lanelle Bal, DO;  Location: [facility];  Service: Endoscopy;;  GASTRIC, MID-ESOPHAGUS   COLONOSCOPY  12/02/2011    Surgeon: Jon Gills, MD; entire colonic mucosa with marked friability, granularity, edema, and erythema with no skip lesions or rectal sparing.  Unable to negotiate through the ascending colon to directly visualize the cecum.  Multiple biopsies were taken and all revealed chronic active colitis consistent with IBD.   COLONOSCOPY  2019   [facility]- Moderately active UC, pancolitis with some patulous in the rectum.  Pathology: TI biopsy benign, cecal biopsy with quiescent chronic colitis, no active colitis, ascending and transverse colon biopsies benign,  mild chronic active colitis in the descending and sigmoid colon, mild active colitis in the rectum.   ESOPHAGEAL BRUSHING  01/09/2021   Procedure: ESOPHAGEAL BRUSHING;  Surgeon: Lanelle Bal, DO;  [facility];  Service: Endoscopy;;   ESOPHAGOGASTRODUODENOSCOPY  2019   [facility]; Normal exam.  Duodenal biopsies with no inflammation or villous blunting.  Gastric biopsies with mild patchy chronic gastritis.  H. pylori negative.  Esophageal biopsies benign.   ESOPHAGOGASTRODUODENOSCOPY (EGD) WITH PROPOFOL N/A 01/09/2021   Procedure: ESOPHAGOGASTRODUODENOSCOPY (EGD) WITH PROPOFOL;  Surgeon: Lanelle Bal, DO;  Location: [facility];  Service: Endoscopy;  Laterality: N/A;  2:45pm   FEMUR IM NAIL Left 04/05/2021   Procedure: INTRAMEDULLARY (IM) RETROGRADE FEMORAL NAILING;  Surgeon: Bjorn Pippin, MD;  Location: [facility] OR;  Service: Orthopedics;  Laterality: Left;   HARDWARE REMOVAL Left 10/16/2021   Procedure: HARDWARE REMOVAL DISTAL FEMUR;  Surgeon: Roby Lofts, MD;  Location: [facility] OR;  Service: Orthopedics;  Laterality: Left;   I & D EXTREMITY Left 04/05/2021   Procedure: IRRIGATION AND DEBRIDEMENT LEFT LEG;  Surgeon: Bjorn Pippin, MD;  Location: [facility] OR;  Service: Orthopedics;  Laterality: Left;   ORIF HUMERUS FRACTURE Left 04/06/2021   Procedure: OPEN REDUCTION INTERNAL FIXATION (ORIF) DISTAL HUMERUS FRACTURE;  Surgeon: Roby Lofts, MD;  Location: [facility] OR;  Service: Orthopedics;  Laterality: Left;   ORIF PELVIC FRACTURE N/A 04/06/2021   Procedure: OPEN REDUCTION INTERNAL FIXATION (ORIF) PELVIC FRACTURE;  Surgeon: Roby Lofts, MD;  Location: [facility] OR;  Service: Orthopedics;  Laterality: N/A;     Current Meds  Medication Sig   escitalopram (LEXAPRO) 5 MG tablet Take 5 mg by mouth daily.   Upadacitinib ER (RINVOQ) 30 MG TB24 TAKE 1 TABLET BY MOUTH DAILY     Family History  Problem Relation Age of Onset   Diabetes Other        Maternal family   Asthma Other        maternal family    Cancer Other        maternal family   Heart disease Other        Maternal GGF later in life    Ulcerative colitis Other        Unrelated step maternal GF    GI problems Cousin        Mother began asking paternal family about GI problems and learned of 2 young cousins with "bowel problems".    Colon cancer Neg Hx     Social History   Socioeconomic History   Marital status: Single    Spouse name: Not on file   Number of children: Not on file   Years of education: Not on file   Highest education level: Not on file  Occupational History   Not on file  Tobacco Use   Smoking status: Former    Types: E-cigarettes    Passive exposure: Past   Smokeless tobacco: Former    Types: Engineer, drilling   Vaping status: Former  Substance and Sexual Activity   Alcohol use: Not Currently   Drug use: Not Currently   Sexual activity: Never  Other Topics Concern   Not on file  Social History Narrative   ** Merged History Encounter **       Social Drivers of Health   Financial Resource Strain: Low Risk  (04/17/2021)   Received from [facility]   Overall Financial Resource Strain (CARDIA)    Difficulty of Paying Living Expenses: Not hard at all  Food Insecurity: No Food Insecurity (04/17/2021)   Received from [facility]   Hunger Vital Sign  Transportation Needs: No Transportation Needs (04/17/2021)   Received from [facility], [facility]   PRAPARE - Transportation    Lack of Transportation (Medical): No    Lack of Transportation (Non-Medical): No  Physical Activity: Not on file  Stress: Not on file  Social Connections: Not on file       Review of Systems: Gen: Denies fever, chills, anorexia. Denies fatigue, weakness, weight loss.  CV: Denies chest pain, palpitations, syncope, peripheral edema, and claudication. Resp: Denies dyspnea at rest, cough, wheezing, coughing up blood, and pleurisy. GI: see HPI Derm: Denies rash, itching, dry skin Psych:  Denies depression, anxiety, memory loss, confusion. No homicidal or suicidal ideation.  Heme: Denies bruising, bleeding, and enlarged lymph nodes.  Observations/Objective: No distress. Unable to perform physical exam due to video encounter.   Assessment: *Ulcerative pancolitis *Chronic immunomodulator therapy with Rinvoq *Acne vulgaris-improved   Plan: Discussed ulcerative colitis in depth with patient today.     Patient was doing  quite well on Rinvoq but lapsed in treatment which he states was his fault.  He is contacted specialty pharmacy and is scheduling shipment this week.   Counseled on the importance of adequate follow-up and completing blood work as scheduled.  Recent blood work with elevated lymphocytes and eosinophils.  Patient states he had COVID at the time of blood work.  Did not have this blood work rechecked as requested.   Hepatitis B and varicella vaccines completed 02/24/2021.   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   Counseled patient on the increased risk of cardiovascular effects, blood clot while taking this class of medications as well as side effects from decreased immune system and he understands.   Needs yearly skin checks.    Patient wishes to establish with local GI as he is now nearly 2 hours from this office.  I will reach out to [facility] digestive health.  Patient needs surveillance colonoscopy.  He can have this done locally.   Follow Up Instructions: I discussed the assessment and treatment plan with the patient. The patient was provided an opportunity to ask questions and all were answered. The patient agreed with the plan and demonstrated an understanding of the instructions.   The patient was advised to call back or seek an in-person evaluation if the symptoms worsen or if the condition fails to improve as anticipated.  I provided >21 minutes of face-to-face time during this MyChart Video encounter.  Hennie Duos. Arzell Mcgeehan  D.O. [facility]

## 2023-09-08 ENCOUNTER — Telehealth: Payer: Medicaid Other | Admitting: Internal Medicine

## 2023-09-08 ENCOUNTER — Telehealth: Payer: Self-pay

## 2023-09-08 NOTE — Telephone Encounter (Signed)
Dr Marletta Lor  FYI: Phoned the pt regarding [employer] trying to contact him regarding his insurance and his medication. Pt stated he had just got off the phone with them and that he does not have insurance anymore. Pt states he cannot get more Rinvoq until his insurance is straightened out

## 2023-09-27 ENCOUNTER — Telehealth: Payer: Self-pay

## 2023-09-27 NOTE — Telephone Encounter (Signed)
Dr Marletta Lor,  I just looked up the cost of a 30 day supply of Rinvoq and it is over $7,000.00 I know no matter what discount card he pays he cannot afford. Maybe there is something he can take until his insurance goes into effect

## 2023-09-27 NOTE — Telephone Encounter (Signed)
Pt advised me that he will not have effective insurance until March 1st. She states he needed to get back on his Rinvoq. He states he is willing to pay out of his pocket until then. Please sdvise

## 2023-09-28 NOTE — Telephone Encounter (Signed)
Dr Marletta Lor, Documentation from [employer] Pt assistance support sent over a form for you to fill out regarding the pt's Rx (section 9). This will be on your desk for next week

## 2023-10-10 NOTE — Telephone Encounter (Signed)
 I have a 30 day supply sample box in my office. He can use that to hold him over. I was under the impression he is establishing GI care going forward at [facility] as he lives in [place] now?

## 2023-10-11 NOTE — Telephone Encounter (Signed)
 Noted   Phoned the pt and LMOVM for the pt to return call / leave a message

## 2023-10-14 NOTE — Telephone Encounter (Signed)
 Phoned and LMOVM for the pt to return call regarding medication and change of provider

## 2023-10-17 NOTE — Telephone Encounter (Signed)
 Dr Marletta Lor:  Lorain Childes:  The medication Rinvoq that was in your office expired 10/28/2022. I am still waiting on the pt to return my call

## 2023-10-26 ENCOUNTER — Telehealth: Payer: Self-pay

## 2023-10-26 NOTE — Telephone Encounter (Signed)
 Pt LM that he had seen another Dr and he is on Prednisone. Pt has insurance now but we still don't have it. In your last ov note pt was establishing his care closer to where he lives. Pt also wants to know will he need an ov with [place]. Please advise.

## 2023-11-01 ENCOUNTER — Encounter: Payer: Self-pay | Admitting: Internal Medicine

## 2023-11-09 ENCOUNTER — Other Ambulatory Visit: Payer: Self-pay

## 2023-11-09 MED ORDER — RINVOQ 30 MG PO TB24
ORAL_TABLET | ORAL | 6 refills | Status: DC
  Filled 2023-11-10: qty 30, 30d supply, fill #0

## 2023-11-10 ENCOUNTER — Other Ambulatory Visit: Payer: Self-pay

## 2023-11-10 ENCOUNTER — Encounter (HOSPITAL_COMMUNITY): Payer: Self-pay

## 2023-11-11 ENCOUNTER — Other Ambulatory Visit: Payer: Self-pay

## 2023-11-15 ENCOUNTER — Other Ambulatory Visit: Payer: Self-pay

## 2023-11-15 MED ORDER — RINVOQ 30 MG PO TB24: ORAL_TABLET | ORAL | 6 refills | Status: DC

## 2023-11-17 ENCOUNTER — Other Ambulatory Visit: Payer: Self-pay

## 2023-11-17 NOTE — Progress Notes (Signed)
 Patient will be filling with [facility]. Dis-enrolling.

## 2023-11-22 ENCOUNTER — Other Ambulatory Visit (HOSPITAL_COMMUNITY): Payer: Self-pay

## 2023-11-22 ENCOUNTER — Other Ambulatory Visit: Payer: Self-pay

## 2023-11-22 MED ORDER — RINVOQ 30 MG PO TB24
ORAL_TABLET | ORAL | 6 refills | Status: AC
  Filled 2023-11-24: qty 30, fill #0
  Filled 2023-11-25 – 2023-11-28 (×5): qty 30, 30d supply, fill #0

## 2023-11-24 ENCOUNTER — Other Ambulatory Visit: Payer: Self-pay

## 2023-11-24 ENCOUNTER — Other Ambulatory Visit (HOSPITAL_COMMUNITY): Payer: Self-pay

## 2023-11-24 NOTE — Progress Notes (Signed)
 [facility] was unable to fill. Provider sent in new prescription. We do not have active coverage on file. ATC patient. LVM

## 2023-11-25 ENCOUNTER — Other Ambulatory Visit: Payer: Self-pay

## 2023-11-28 ENCOUNTER — Other Ambulatory Visit: Payer: Self-pay | Admitting: Internal Medicine

## 2023-11-28 ENCOUNTER — Other Ambulatory Visit: Payer: Self-pay

## 2023-11-28 MED ORDER — PREDNISONE 10 MG PO TABS: ORAL_TABLET | ORAL | 0 refills | Status: AC

## 2023-12-08 ENCOUNTER — Telehealth: Admitting: Internal Medicine

## 2023-12-08 DIAGNOSIS — K5100 Ulcerative (chronic) pancolitis without complications: Secondary | ICD-10-CM

## 2023-12-08 DIAGNOSIS — Z79620 Long term (current) use of immunosuppressive biologic: Secondary | ICD-10-CM

## 2023-12-08 NOTE — Progress Notes (Signed)
 Primary Care Physician:  [facility]   Patient Location: Home   Provider Location: [facility] office   Reason for Visit: Ulcerative colitis   Total time (minutes) spent on medical discussion: >21 minutes   Visit was conducted using virtual method.  Visit was requested by patient.  Virtual Visit via MyChart Video Note I connected with Jerry Cordova on 12/08/23 at 10:10 AM EDT by video and verified that I am speaking with the correct person using two identifiers.   I discussed the limitations, risks, security and privacy concerns of performing an evaluation and management service by video and the availability of in person appointments. I also discussed with the patient that there may be a patient responsible charge related to this service. The patient expressed understanding and agreed to proceed.  Chief complaint: Follow up for UC    History of Present Illness: Patient has history of ulcerative pancolitis diagnosed in 2013. Previously followed by pediatric GI at [facility].  He was on Remicade at one point, but he developed antibodies and became symptomatic so he was transitioned to Humira  with methotrexate.  Notably, he also had urticarial rashes with Remicade infusions and this was treated by antihistamines.  Last seen by [facility] in May 2020 and was on Humira  40 mg every other week with methotrexate 15mg  weekly and folate.  Symptoms were doing well at that time and he was advised to continue his current medication regimen.  Colonoscopy and EGD in 2019: Colonoscopy impression: Moderately active ulcerative colitis; pancolitis with some patulous in the rectum, biopsied.  The examined portion of the ileum was normal s/p biopsy.  TI biopsy was benign, cecal biopsy with quiescent chronic colitis, no active colitis, ascending colon biopsy benign, transverse colon biopsy benign, mild chronic active colitis in the descending and sigmoid colon.  Rectum with mild active colitis, no  chronic colitis.   EGD: Normal exam.  Duodenal biopsies with no inflammation or villous blunting.  Gastric biopsies with mild patchy chronic gastritis.  H. pylori negative.  Esophageal biopsies benign.  Hepatitis B and varicella vaccines completed 02/24/2021.  December 2022.  I saw patient in office, at that time he was chronically on budesonide  and doing well.  We discussed treatment options for management of his UC and he decided to go back on Humira .  I ordered blood work to be performed to start that process.  Unfortunately patient never went to have blood work done.  He also never followed up with me.  Presented to [facility], ER 12/25/2021 for worsening abdominal pain, rectal bleeding, diarrhea..  CT showed pancolitis.  I was contacted by the ER.  I recommended stool studies and 8 week taper of prednisone .  Also recommended patient go have his blood work done and follow-up with me.  Followed up with me May 2023.  Stool studies not completed.  He did go have his blood work performed.  CRP 9.1, ESR 17, hepatitis B surface antibody positive, core antibody negative, surface antigen negative, QuantiFERON negative.  He was restarted on Humira .  Took this medication for 3 to 4 months without improvement in his symptoms.  Presented to [facility], ER 03/26/2022 with abdominal pain, rectal bleeding, diarrhea.  CT abdomen pelvis with contrast showed colon wall thickening in the ascending and transverse colon.  Given another prednisone  taper.  Followed up 05/19/22. Discussed multiple options including Entyvio, Stelara, Rinvoq . He decided to pursue Rinvoq . Started on 45 mg daily x8 weeks, now on 30 mg  daily.  Rather quickly got his UC under good control.  Symptoms vastly improved.  Did develop acne on his back from the Rinvoq , treated with topical adapalene  and this has resolved.  Blood work 11/30/2022: WBC WNL though Lymphs and eosinophils elevated, CRP normal, ESR is slightly elevated at 22, lipase normal,  CMP normal.  Advised to repeat CBC though this never got done.  Recently moved to [facility] [place] .  Virtual visit today.  Patient reports that he lost Medicaid.  He missed open enrollment with his current employer so had to go to [employer] for insurance.  Unfortunately not covering Rinvoq .  He has been off this medication for over a month.  We have submitted paperwork to [facility] assist and currently waiting to hear back.  Had resurgence of his symptoms diarrhea abdominal pain.  Was placed on prednisone   and states his symptoms are much improved.  Past Medical History:  Diagnosis Date   Anxiety    Is in therapy, not on medication   Chest wall injury 08/16/2008   Hit by baseball, radiography without fracture, discharged home   Chronic ulcerative colitis (HCC)    Complication of anesthesia    seizures coming out of anesthesia once   Ulcerative colitis [facility])      Past Surgical History:  Procedure Laterality Date   BALLOON DILATION N/A 01/09/2021   Procedure: BALLOON DILATION;  Surgeon: Vinetta Greening, DO;  Location: [facility];  Service: Endoscopy;  Laterality: N/A;   BIOPSY  01/09/2021   Procedure: BIOPSY;  Surgeon: Vinetta Greening, DO;  Location: [facility];  Service: Endoscopy;;  GASTRIC, MID-ESOPHAGUS   COLONOSCOPY  12/02/2011    Surgeon: Fortunato Ill, MD; entire colonic mucosa with marked friability, granularity, edema, and erythema with no skip lesions or rectal sparing.  Unable to negotiate through the ascending colon to directly visualize the cecum.  Multiple biopsies were taken and all revealed chronic active colitis consistent with IBD.   COLONOSCOPY  2019   [facility]- Moderately active UC, pancolitis with some patulous in the rectum.  Pathology: TI biopsy benign, cecal biopsy with quiescent chronic colitis, no active colitis, ascending and transverse colon biopsies benign, mild chronic active colitis in the descending and sigmoid colon, mild active colitis in the  rectum.   ESOPHAGEAL BRUSHING  01/09/2021   Procedure: ESOPHAGEAL BRUSHING;  Surgeon: Vinetta Greening, DO;  [facility];  Service: Endoscopy;;   ESOPHAGOGASTRODUODENOSCOPY  2019   [facility]; Normal exam.  Duodenal biopsies with no inflammation or villous blunting.  Gastric biopsies with mild patchy chronic gastritis.  H. pylori negative.  Esophageal biopsies benign.   ESOPHAGOGASTRODUODENOSCOPY (EGD) WITH PROPOFOL  N/A 01/09/2021   Procedure: ESOPHAGOGASTRODUODENOSCOPY (EGD) WITH PROPOFOL ;  Surgeon: Vinetta Greening, DO;  Location[facility];  Service: Endoscopy;  Laterality: N/A;  2:45pm   FEMUR IM NAIL Left 04/05/2021   Procedure: INTRAMEDULLARY (IM) RETROGRADE FEMORAL NAILING;  Surgeon: Micheline Ahr, MD;  Location: [facility] OR;  Service: Orthopedics;  Laterality: Left;   HARDWARE REMOVAL Left 10/16/2021   Procedure: HARDWARE REMOVAL DISTAL FEMUR;  Surgeon: Laneta Pintos, MD;  Location: [facility] OR;  Service: Orthopedics;  Laterality: Left;   I & D EXTREMITY Left 04/05/2021   Procedure: IRRIGATION AND DEBRIDEMENT LEFT LEG;  Surgeon: Micheline Ahr, MD;  Location: [facility] OR;  Service: Orthopedics;  Laterality: Left;   ORIF HUMERUS FRACTURE Left 04/06/2021   Procedure: OPEN REDUCTION INTERNAL FIXATION (ORIF) DISTAL HUMERUS FRACTURE;  Surgeon: Curtiss Dowdy,  Florentina Huntsman, MD;  Location: [facility] OR;  Service: Orthopedics;  Laterality: Left;   ORIF PELVIC FRACTURE N/A 04/06/2021   Procedure: OPEN REDUCTION INTERNAL FIXATION (ORIF) PELVIC FRACTURE;  Surgeon: Laneta Pintos, MD;  Location: [facility] OR;  Service: Orthopedics;  Laterality: N/A;     No outpatient medications have been marked as taking for the 12/08/23 encounter (Video Visit) with Vinetta Greening, DO.     Family History  Problem Relation Age of Onset   Diabetes Other        Maternal family   Asthma Other        maternal family   Cancer Other        maternal family   Heart disease Other        Maternal GGF later in life    Ulcerative colitis Other         Unrelated step maternal GF    GI problems Cousin        Mother began asking paternal family about GI problems and learned of 2 young cousins with "bowel problems".    Colon cancer Neg Hx     Social History   Socioeconomic History   Marital status: Single    Spouse name: Not on file   Number of children: Not on file   Years of education: Not on file   Highest education level: Not on file  Occupational History   Not on file  Tobacco Use   Smoking status: Former    Types: E-cigarettes    Passive exposure: Past   Smokeless tobacco: Former    Types: Engineer, drilling   Vaping status: Former  Substance and Sexual Activity   Alcohol use: Not Currently   Drug use: Not Currently   Sexual activity: Never  Other Topics Concern   Not on file  Social History Narrative   ** Merged History Encounter **       Social Drivers of Health   Financial Resource Strain: Low Risk  (04/17/2021)   Received [facility], [facility]   Overall Financial Resource Strain (CARDIA)    Difficulty of Paying Living Expenses: Not hard at all  Food Insecurity: No Food Insecurity (04/17/2021)   Received from [facility]   Hunger Vital Sign  Transportation Needs: No Transportation Needs (04/17/2021)   Received from [facility], [facility]   PRAPARE - Transportation    Lack of Transportation (Medical): No    Lack of Transportation (Non-Medical): No  Physical Activity: Not on file  Stress: Not on file  Social Connections: Not on file       Review of Systems: Gen: Denies fever, chills, anorexia. Denies fatigue, weakness, weight loss.  CV: Denies chest pain, palpitations, syncope, peripheral edema, and claudication. Resp: Denies dyspnea at rest, cough, wheezing, coughing up blood, and pleurisy. GI: see HPI Derm: Denies rash, itching, dry skin Psych: Denies depression, anxiety, memory loss, confusion. No homicidal or suicidal ideation.  Heme: Denies bruising, bleeding, and enlarged  lymph nodes.  Observations/Objective: No distress. Unable to perform physical exam due to video encounter.   Assessment: *Ulcerative pancolitis *Chronic immunomodulator therapy with Rinvoq  *Acne vulgaris-improved   Plan: Discussed ulcerative colitis in depth with patient today.     Patient was doing quite well on Rinvoq  but lapsed in treatment due to insurance issues.  Currently awaiting decision from [employer] assist.  If we cannot get this covered, we will need to consider different biologic.  Discussed possibly  started on Stelara, [employer] also has a patient assistance program for patients with commercial insurance.   Counseled on the importance of adequate follow-up and completing blood work as scheduled.  .   Hepatitis B and varicella vaccines completed 02/24/2021.   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   Counseled patient on the increased risk of cardiovascular effects, blood clot while taking this class of medications as well as side effects from decreased immune system and he understands.   Needs yearly skin checks.   Needs updated blood work prior to starting biologic therapy.  Will order to have done locally.  Needs colonoscopy though we will hold off on now given his recent flare in symptoms.  Follow-up virtual visit in 6 weeks.      Follow Up Instructions: I discussed the assessment and treatment plan with the patient. The patient was provided an opportunity to ask questions and all were answered. The patient agreed with the plan and demonstrated an understanding of the instructions.   The patient was advised to call back or seek an in-person evaluation if the symptoms worsen or if the condition fails to improve as anticipated.  I provided >21 minutes of face-to-face time during this MyChart Video encounter.  Rolando Cliche. Salvatore Shear D.O. [facility]

## 2023-12-30 ENCOUNTER — Other Ambulatory Visit: Payer: Self-pay

## 2023-12-30 NOTE — Progress Notes (Addendum)
 Patient was approved through [employer] and will be getting the medication from the manufacturer. Dis-enrolling.

## 2024-06-07 ENCOUNTER — Telehealth: Payer: Self-pay | Admitting: Internal Medicine

## 2024-06-07 NOTE — Telephone Encounter (Signed)
 Sent Dr Cindie a message in secure chat regarding labs before pt's appt

## 2024-06-07 NOTE — Telephone Encounter (Signed)
 Pt schedule appointment with Dr Cindie in December and mentioned if Dr Cindie would like him to get blood work done prior to his appointment on December 3.

## 2024-06-14 ENCOUNTER — Encounter: Payer: Self-pay | Admitting: Internal Medicine

## 2024-06-15 NOTE — Telephone Encounter (Signed)
 Pt sent a MyChart message regarding whether / whether not to have labs done before appt. Pt was advised once Dr Cindie advises me I will let him know, but of course come to appt anyway

## 2024-07-18 ENCOUNTER — Ambulatory Visit: Admitting: Internal Medicine

## 2024-07-18 ENCOUNTER — Encounter: Payer: Self-pay | Admitting: Internal Medicine

## 2024-08-20 NOTE — Telephone Encounter (Signed)
 Pt phoned this morning regarding his new insurance and to call anyday of the week but try and do lunch period. Pt also wants referral to another Dr. Because his drive is 2.5 hours from here. Will send Dr Cindie a note after speaking with Dr Cindie

## 2024-08-29 NOTE — Telephone Encounter (Signed)
 Sent another message to Dr Cindie in secure chat regarding pt's message. Waiting on a response

## 2024-08-30 NOTE — Telephone Encounter (Signed)
 Jerry Cordova,  A referral for this pt needs to be sent to [facility] per Dr Cindie. Send the pt's last ov note and labs. Thank you

## 2024-08-30 NOTE — Telephone Encounter (Signed)
 Per Dr Cindie please send the pt's last ov.

## 2024-08-30 NOTE — Telephone Encounter (Signed)
 yes [place] and I will get [facility] working on referral to [employer]. He hasn't brought me his insurance for his speciality medication so I guess he will wait until he see's another Dr but I thought he needed not to miss his medication. anything in particular you want us  to send in this referral? please advise.

## 2024-08-30 NOTE — Telephone Encounter (Signed)
 Referral sent for [facility]

## 2024-08-30 NOTE — Telephone Encounter (Signed)
(  From Dr Cindie in secure chat)   Did he miss his fu with me??  yes he was a no show for his appt 07/18/24  Where does he currently live?  [place]?  [place] If so I would refer him to Dr. Krogel at [facility]. I previously spoke to Dr Krogel about this patient when we discussed getting him care locally.

## 2024-08-30 NOTE — Telephone Encounter (Signed)
 Wrote down the Dx codes needed and gave it Alan Lunger

## 2024-09-18 ENCOUNTER — Telehealth: Payer: Self-pay | Admitting: *Deleted

## 2024-09-19 NOTE — Telephone Encounter (Signed)
 Called the number given and it is not in service
